# Patient Record
Sex: Male | Born: 1970 | Race: Black or African American | Hispanic: No | Marital: Single | Smoking: Current every day smoker
Health system: Southern US, Community
[De-identification: ages and names within clinical notes are randomized; demographics above are authoritative.]

## PROBLEM LIST (undated history)

## (undated) DIAGNOSIS — K219 Gastro-esophageal reflux disease without esophagitis: Secondary | ICD-10-CM

## (undated) DIAGNOSIS — F32A Depression, unspecified: Secondary | ICD-10-CM

## (undated) DIAGNOSIS — M1990 Unspecified osteoarthritis, unspecified site: Secondary | ICD-10-CM

## (undated) DIAGNOSIS — F419 Anxiety disorder, unspecified: Secondary | ICD-10-CM

## (undated) DIAGNOSIS — M21379 Foot drop, unspecified foot: Secondary | ICD-10-CM

## (undated) HISTORY — PX: HERNIA REPAIR: SHX51

## (undated) HISTORY — PX: CERVICAL LAMINECTOMY: SHX94

## (undated) HISTORY — DX: Anxiety disorder, unspecified: F41.9

## (undated) HISTORY — DX: Unspecified osteoarthritis, unspecified site: M19.90

---

## 2014-07-30 ENCOUNTER — Encounter (HOSPITAL_COMMUNITY): Payer: Self-pay

## 2014-07-30 ENCOUNTER — Emergency Department (HOSPITAL_COMMUNITY)
Admission: EM | Admit: 2014-07-30 | Discharge: 2014-07-30 | Disposition: A | Discharge status: Home / Self Care | Payer: Self-pay | Attending: Emergency Medicine | Admitting: Emergency Medicine

## 2014-07-30 DIAGNOSIS — M79602 Pain in left arm: Secondary | ICD-10-CM | POA: Insufficient documentation

## 2014-07-30 DIAGNOSIS — Z720 Tobacco use: Secondary | ICD-10-CM | POA: Insufficient documentation

## 2014-07-30 MED ORDER — KETOROLAC TROMETHAMINE 60 MG/2ML IM SOLN
60.0000 mg | Freq: Once | INTRAMUSCULAR | Status: AC
  Administered 2014-07-30: 60 mg via INTRAMUSCULAR
  Filled 2014-07-30: qty 2

## 2014-07-30 MED ORDER — NAPROXEN 500 MG PO TABS: 500.0000 mg | ORAL_TABLET | Freq: Two times a day (BID) | ORAL | Status: DC

## 2014-07-30 MED ORDER — DIAZEPAM 5 MG PO TABS
5.0000 mg | ORAL_TABLET | Freq: Once | ORAL | Status: AC
  Administered 2014-07-30: 5 mg via ORAL
  Filled 2014-07-30: qty 1

## 2014-07-30 NOTE — ED Provider Notes (Signed)
CSN: [id number]     Arrival date & time 07/30/14  1112 History   First MD Initiated Contact with Patient 07/30/14 1156     Chief Complaint  Patient presents with  . Neck Pain     (Consider location/radiation/quality/duration/timing/severity/associated sxs/prior Treatment) HPI Thomas Rocha is a 44 y.o. male with a history of cervical laminectomy comes in for evaluation of left neck and arm pain. Patient states he has had "a crick in my neck for the past week". He reports this morning at approximately 3 AM he woke up with sharp shooting pains down his left arm that radiated into his thumb, index and middle finger. He rates this pain as severe. He is tried Tylenol extra strength without relief. Denies numbness or weakness. No headache, vision changes, difficulties speaking or swallowing, chest pain, short of breath, nausea or vomiting, abdominal pain, dark or bloody stools, dizziness, syncope.  History reviewed. No pertinent past medical history. Past Surgical History  Procedure Laterality Date  . Cervical laminectomy    . Hernia repair     History reviewed. No pertinent family history. History  Substance Use Topics  . Smoking status: Current Every Day Smoker -- 1.00 packs/day    Types: Cigarettes  . Smokeless tobacco: Not on file  . Alcohol Use: 3.0 oz/week    5 Cans of beer per week     Comment: 5 beers three timies a week     Review of Systems A 10 point review of systems was completed and was negative except for pertinent positives and negatives as mentioned in the history of present illness     Allergies  Review of patient's allergies indicates no known allergies.  Home Medications   Prior to Admission medications   Medication Sig Start Date End Date Taking? Authorizing Provider  acetaminophen (TYLENOL) 500 MG tablet Take 500 mg by mouth every 6 (six) hours as needed for mild pain.   Yes Historical Provider, MD  naproxen (NAPROSYN) 500 MG tablet Take 1 tablet (500 mg  total) by mouth 2 (two) times daily. 07/30/14   Comer Locket, PA-C   BP 118/78 mmHg  Pulse 59  Temp(Src) 98 F (36.7 C) (Oral)  Resp 16  SpO2 99% Physical Exam  Constitutional: He is oriented to person, place, and time. He appears well-developed and well-nourished.  HENT:  Head: Normocephalic and atraumatic.  Mouth/Throat: Oropharynx is clear and moist.  Eyes: Conjunctivae are normal. Pupils are equal, round, and reactive to light. Right eye exhibits no discharge. Left eye exhibits no discharge. No scleral icterus.  Neck: Normal range of motion. Neck supple.  Old, Linear surgical scar noted over cervical spine. No tenderness to cervical spine, maintains full active range of motion.  Cardiovascular: Normal rate, regular rhythm and normal heart sounds.   Pulmonary/Chest: Effort normal and breath sounds normal. No respiratory distress. He has no wheezes. He has no rales.  Abdominal: Soft. There is no tenderness.  Musculoskeletal: Normal range of motion. He exhibits no edema or tenderness.  Tenderness to palpation over medial epicondyle of left elbow and ulnar nerve.  Neurological: He is alert and oriented to person, place, and time.  Cranial Nerves II-XII grossly intact. Left hand grip strength decreased secondary to discomfort. Completes cardinal hand movements without difficulty.  Skin: Skin is warm and dry. No rash noted.  Psychiatric: He has a normal mood and affect.  Nursing note and vitals reviewed.   ED Course  Procedures (including critical care time) Labs Review Labs Reviewed -  No data to display  Imaging Review No results found.   EKG Interpretation None     Meds given in ED:  Medications  ketorolac (TORADOL) injection 60 mg (60 mg Intramuscular Given 07/30/14 1430)  diazepam (VALIUM) tablet 5 mg (5 mg Oral Given 07/30/14 1429)    Discharge Medication List as of 07/30/2014  2:59 PM    START taking these medications   Details  naproxen (NAPROSYN) 500 MG tablet  Take 1 tablet (500 mg total) by mouth 2 (two) times daily., Starting 07/30/2014, Until Discontinued, Print       Filed Vitals:   07/30/14 1252 07/30/14 1445 07/30/14 1500 07/30/14 1506  BP: 113/64 118/88 118/78 118/78  Pulse: 63 57 60 59  Temp:      TempSrc:      Resp: 16   16  SpO2: 100% 98% 99% 99%    MDM  Vitals stable - WNL -afebrile Pt resting comfortably in ED. states he feels much better after administration of medications in the ED. PE----on reevaluation, patient has full range of motion of left arm and grip strength is intact and equal bilaterally. No neurologic deficits.  DDX--discomfort appears to be musculoskeletal, will DC with anti-inflammatories and instructions for further symptomatic support with stretching, hot cold compresses.  I discussed all relevant lab findings and imaging results with pt and they verbalized understanding. Discussed f/u with PCP within 48 hrs and return precautions, pt very amenable to plan.  Final diagnoses:  Left arm pain        Comer Locket, PA-C 07/30/14 [place], MD 07/31/14 1435

## 2014-07-30 NOTE — ED Notes (Signed)
Pt woke up 2-3 days ago with left neck pain, pt felt like he slept wrong.  Onset this morning pt has shooting pain down left arm and numbness to thumb, index and middle fingers.  No known injuries.

## 2014-07-30 NOTE — Discharge Instructions (Signed)
Please take your medications as directed. You may follow-up with primary care for further evaluation and management of your symptoms. Return to ED for new or worsening symptoms.  Heat Therapy Heat therapy can help make painful, stiff muscles and joints feel better. Do not use heat on new injuries. Wait at least 48 hours after an injury to use heat. Do not use heat when you have aches or pains right after an activity. If you still have pain 3 hours after stopping the activity, then you may use heat. HOME CARE Wet heat pack  Soak a clean towel in warm water. Squeeze out the extra water.  Put the warm, wet towel in a plastic bag.  Place a thin, dry towel between your skin and the bag.  Put the heat pack on the area for 5 minutes, and check your skin. Your skin may be pink, but it should not be red.  Leave the heat pack on the area for 15 to 30 minutes.  Repeat this every 2 to 4 hours while awake. Do not use heat while you are sleeping. Warm water bath  Fill a tub with warm water.  Place the affected body part in the tub.  Soak the area for 20 to 40 minutes.  Repeat as needed. Hot water bottle  Fill the water bottle half full with hot water.  Press out the extra air. Close the cap tightly.  Place a dry towel between your skin and the bottle.  Put the bottle on the area for 5 minutes, and check your skin. Your skin may be pink, but it should not be red.  Leave the bottle on the area for 15 to 30 minutes.  Repeat this every 2 to 4 hours while awake. Electric heating pad  Place a dry towel between your skin and the heating pad.  Set the heating pad on low heat.  Put the heating pad on the area for 10 minutes, and check your skin. Your skin may be pink, but it should not be red.  Leave the heating pad on the area for 20 to 40 minutes.  Repeat this every 2 to 4 hours while awake.  Do not lie on the heating pad.  Do not fall asleep while using the heating pad.  Do not  use the heating pad near water. GET HELP RIGHT AWAY IF:  You get blisters or red skin.  Your skin is puffy (swollen), or you lose feeling (numbness) in the affected area.  You have any new problems.  Your problems are getting worse.  You have any questions or concerns. If you have any problems, stop using heat therapy until you see your doctor. MAKE SURE YOU:  Understand these instructions.  Will watch your condition.  Will get help right away if you are not doing well or get worse. Document Released: 06/03/2011 Document Reviewed: 05/04/2013 [facility] Patient Information 2015 [place]. This information is not intended to replace advice given to you by your health care provider. Make sure you discuss any questions you have with your health care provider.  Musculoskeletal Pain Musculoskeletal pain is muscle and boney aches and pains. These pains can occur in any part of the body. Your caregiver may treat you without knowing the cause of the pain. They may treat you if blood or urine tests, X-rays, and other tests were normal.  CAUSES There is often not a definite cause or reason for these pains. These pains may be caused by a type  of germ (virus). The discomfort may also come from overuse. Overuse includes working out too hard when your body is not fit. Boney aches also come from weather changes. Bone is sensitive to atmospheric pressure changes. HOME CARE INSTRUCTIONS   Ask when your test results will be ready. Make sure you get your test results.  Only take over-the-counter or prescription medicines for pain, discomfort, or fever as directed by your caregiver. If you were given medications for your condition, do not drive, operate machinery or power tools, or sign legal documents for 24 hours. Do not drink alcohol. Do not take sleeping pills or other medications that may interfere with treatment.  Continue all activities unless the activities cause more pain. When the pain  lessens, slowly resume normal activities. Gradually increase the intensity and duration of the activities or exercise.  During periods of severe pain, bed rest may be helpful. Lay or sit in any position that is comfortable.  Putting ice on the injured area.  Put ice in a bag.  Place a towel between your skin and the bag.  Leave the ice on for 15 to 20 minutes, 3 to 4 times a day.  Follow up with your caregiver for continued problems and no reason can be found for the pain. If the pain becomes worse or does not go away, it may be necessary to repeat tests or do additional testing. Your caregiver may need to look further for a possible cause. SEEK IMMEDIATE MEDICAL CARE IF:  You have pain that is getting worse and is not relieved by medications.  You develop chest pain that is associated with shortness or breath, sweating, feeling sick to your stomach (nauseous), or throw up (vomit).  Your pain becomes localized to the abdomen.  You develop any new symptoms that seem different or that concern you. MAKE SURE YOU:   Understand these instructions.  Will watch your condition.  Will get help right away if you are not doing well or get worse. Document Released: 03/11/2005 Document Revised: 06/03/2011 Document Reviewed: 11/13/2012 [facility] Patient Information 2015 [place], [place]. This information is not intended to replace advice given to you by your health care provider. Make sure you discuss any questions you have with your health care provider.

## 2017-01-27 ENCOUNTER — Other Ambulatory Visit: Payer: Self-pay | Admitting: Family Medicine

## 2017-01-27 DIAGNOSIS — M545 Low back pain, unspecified: Secondary | ICD-10-CM

## 2017-02-06 ENCOUNTER — Inpatient Hospital Stay
Admission: RE | Admit: 2017-02-06 | Discharge: 2017-02-06 | Disposition: A | Discharge status: Home / Self Care | Payer: Self-pay | Source: Ambulatory Visit | Attending: Family Medicine | Admitting: Family Medicine

## 2019-04-07 ENCOUNTER — Other Ambulatory Visit: Payer: Self-pay

## 2019-04-08 ENCOUNTER — Ambulatory Visit (INDEPENDENT_AMBULATORY_CARE_PROVIDER_SITE_OTHER): Payer: 59 | Admitting: Family Medicine

## 2019-04-08 ENCOUNTER — Encounter: Payer: Self-pay | Admitting: Family Medicine

## 2019-04-08 VITALS — BP 118/78 | HR 68 | Temp 97.6°F | Ht 72.0 in | Wt 205.0 lb

## 2019-04-08 DIAGNOSIS — Z23 Encounter for immunization: Secondary | ICD-10-CM | POA: Diagnosis not present

## 2019-04-08 DIAGNOSIS — M5416 Radiculopathy, lumbar region: Secondary | ICD-10-CM | POA: Diagnosis not present

## 2019-04-08 DIAGNOSIS — R2681 Unsteadiness on feet: Secondary | ICD-10-CM | POA: Diagnosis not present

## 2019-04-08 NOTE — Patient Instructions (Signed)
Preventing Disease Through Immunization Immunization means developing a lower risk of getting a disease due to improvements in the body's disease-fighting system (immune system). Immunization can happen through:  Natural exposure to a disease.  Getting shots (vaccination). Vaccination involves putting a small amount of germs (vaccines) into the body. This may be done through one or more shots. Some vaccines can be given by mouth or as a nasal spray, instead of a shot. Vaccination helps to prevent:  Serious diseases such as polio, measles, and whooping cough.  Common infections, such as the flu. Vaccination starts at birth. Teens and adults also need vaccines regularly. Talk with your health care provider about the immunization schedule that is best for you. Some vaccines need to be repeated when you are older. How does immunization prevent disease? Immunization occurs when the body is exposed to germs that cause a certain disease. The body responds to this exposure by forming proteins (antibodies) to fight those germs. Germs in vaccines are dead or very weak, so they will not make you sick. However, the antibodies that your body makes will stay in your body for a long time. This improves the ability of your immune system to fight the germs in the future. If you get exposed to the germs again, you may be able to resist them (develop immunity against them). This is because your antibodies may be able to destroy the germs before you get sick. Why should I prevent diseases through immunization? Vaccines can protect you from getting diseases that can cause harmful complications and even death. Getting vaccinated also helps to keep other people healthy. If you are vaccinated, you cannot spread disease to others, and that can make the disease become less common. If people keep getting vaccinated, certain diseases may become rare or go away. If people stop getting vaccinated, certain diseases could become  more common. Not everyone can get a vaccine. Very young babies, people who are very sick, or older people may not be able to get vaccines. By getting immunized, you help to protect people who are not able to be vaccinated. Where to find more information To learn more about immunization, visit:  World Health Organization: www.who.int/topics/immunization/en  Centers for Disease Control and Prevention: www.cdc.gov/vaccines/index.html Summary  Immunization occurs when the body is exposed to germs that cause a certain disease and responds by forming proteins (antibodies) to fight those germs.  Getting vaccines is a safe and effective way to develop immunity against specific germs and the diseases that they cause.  Talk with your health care provider about your immunization schedule, and stay up to date with all of your shots. This information is not intended to replace advice given to you by your health care provider. Make sure you discuss any questions you have with your health care provider. Document Revised: 07/03/2018 Document Reviewed: 11/18/2015 Elsevier Patient Education  2020 [employer]  

## 2019-04-08 NOTE — Addendum Note (Signed)
Addended by: Lynda Rainwater on: 04/08/2019 10:49 AM   Modules accepted: Orders

## 2019-04-08 NOTE — Progress Notes (Signed)
New Patient Office Visit  Subjective:  Patient ID: Thomas Rocha, male    DOB: [birth year]  Age: 49 y.o. MRN: [id number]  CC:  Chief Complaint  Patient presents with  . Establish Care    new pt c/o back pains x few years becoming worse pt would like referral for MRI    HPI Thomas Rocha presents for establishment of medical care with multiple issues.  Primarily he complains of lower back pain with movement of the pain down into his right lower extremity tingling in his toes.  Denies saddle paresthesias or bowel or bladder incontinence.  He had been scheduled to have an MRI of his lower back in the past but lost his insurance.  He also reports tingling in both of his hands and his fingertips.  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  History of lumbar laminectomy.  Neck is doing okay at this point.  He also feels unsteady on his feet and with his gait.  He has been using a cane has to use a shopping cart.  Patient works in apartment maintenance.  He lives with his significant other and her brother.  Consumes alcohol cocaine and marijuana.  No past medical history on file.  Past Surgical History:  Procedure Laterality Date  . CERVICAL LAMINECTOMY    . HERNIA REPAIR      Family History  Problem Relation Age of Onset  . Cancer Mother   . Diabetes Maternal Grandmother   . Hypertension Maternal Grandmother     Social History   Socioeconomic History  . Marital status: Single    Spouse name: Not on file  . Number of children: Not on file  . Years of education: Not on file  . Highest education level: Not on file  Occupational History  . Not on file  Tobacco Use  . Smoking status: Current Every Day Smoker    Packs/day: 1.00    Types: Cigarettes  Substance and Sexual Activity  . Alcohol use: Yes    Alcohol/week: 5.0 standard drinks    Types: 5 Cans of beer per week    Comment: 5 beers three timies a week   . Drug use: Yes    Types: Cocaine, Marijuana    Comment: last used  cocaine 5 days ago   . Sexual activity: Yes  Other Topics Concern  . Not on file  Social History Narrative  . Not on file   Social Determinants of Health   Financial Resource Strain:   . Difficulty of Paying Living Expenses: Not on file  Food Insecurity:   . Worried About Charity fundraiser in the Last Year: Not on file  . Ran Out of Food in the Last Year: Not on file  Transportation Needs:   . Lack of Transportation (Medical): Not on file  . Lack of Transportation (Non-Medical): Not on file  Physical Activity:   . Days of Exercise per Week: Not on file  . Minutes of Exercise per Session: Not on file  Stress:   . Feeling of Stress : Not on file  Social Connections:   . Frequency of Communication with Friends and Family: Not on file  . Frequency of Social Gatherings with Friends and Family: Not on file  . Attends Religious Services: Not on file  . Active Member of Clubs or Organizations: Not on file  . Attends Archivist Meetings: Not on file  . Marital Status: Not on file  Intimate Partner  Violence:   . Fear of Current or Ex-Partner: Not on file  . Emotionally Abused: Not on file  . Physically Abused: Not on file  . Sexually Abused: Not on file    ROS Review of Systems  Constitutional: Negative.   Respiratory: Negative.   Cardiovascular: Negative.   Gastrointestinal: Negative.   Genitourinary: Positive for urgency. Negative for difficulty urinating and frequency.  Musculoskeletal: Positive for back pain, gait problem and myalgias. Negative for joint swelling, neck pain and neck stiffness.  Neurological: Positive for numbness. Negative for weakness and headaches.  Hematological: Does not bruise/bleed easily.  Psychiatric/Behavioral: Negative.     Objective:   Today's Vitals: BP 118/78   Pulse 68   Temp 97.6 F (36.4 C) (Tympanic)   Ht 6' (1.829 m)   Wt 205 lb (93 kg)   SpO2 99%   BMI 27.80 kg/m   Physical Exam Constitutional:      Appearance:  Normal appearance. He is obese.  HENT:     Head: Normocephalic and atraumatic.     Right Ear: External ear normal.     Left Ear: External ear normal.  Eyes:     General: No scleral icterus.       Right eye: No discharge.        Left eye: No discharge.     Extraocular Movements: Extraocular movements intact.     Conjunctiva/sclera: Conjunctivae normal.     Pupils: Pupils are equal, round, and reactive to light.  Pulmonary:     Effort: Pulmonary effort is normal.  Musculoskeletal:     Right shoulder: Normal.     Left shoulder: Normal.     Cervical back: Normal range of motion and neck supple. No rigidity or tenderness. Normal range of motion.     Thoracic back: Normal.     Lumbar back: Normal. No tenderness or bony tenderness. Normal range of motion. Negative right straight leg raise test and negative left straight leg raise test.       Back:  Neurological:     General: No focal deficit present.     Mental Status: He is alert and oriented to person, place, and time.     Cranial Nerves: No cranial nerve deficit.     Coordination: Romberg sign negative.     Gait: Gait normal.     Deep Tendon Reflexes: Reflexes normal.     Reflex Scores:      Tricep reflexes are 1+ on the right side and 1+ on the left side.      Bicep reflexes are 1+ on the right side and 1+ on the left side.      Brachioradialis reflexes are 1+ on the right side and 1+ on the left side.      Patellar reflexes are 1+ on the right side and 1+ on the left side.      Achilles reflexes are 1+ on the right side and 1+ on the left side.    Comments: Had difficulty walking on heels.   Psychiatric:        Mood and Affect: Mood normal.        Behavior: Behavior normal.     Assessment & Plan:   Problem List Items Addressed This Visit      Nervous and Auditory   Lumbar radiculopathy - Primary   Relevant Orders   Ambulatory referral to Sports Medicine     Other   Gait instability   Relevant Orders   Ambulatory  referral  to Neurology      Outpatient Encounter Medications as of 04/08/2019  Medication Sig  . acetaminophen (TYLENOL) 500 MG tablet Take 500 mg by mouth every 6 (six) hours as needed for mild pain.  . naproxen (NAPROSYN) 500 MG tablet Take 1 tablet (500 mg total) by mouth 2 (two) times daily. (Patient not taking: Reported on 04/08/2019)   No facility-administered encounter medications on file as of 04/08/2019.    Follow-up: Return Return fasting for physical exam..   Libby Maw, MD

## 2019-04-09 ENCOUNTER — Encounter: Payer: Self-pay | Admitting: Neurology

## 2019-04-15 ENCOUNTER — Other Ambulatory Visit: Payer: Self-pay

## 2019-04-16 ENCOUNTER — Encounter: Payer: Self-pay | Admitting: Family Medicine

## 2019-04-16 ENCOUNTER — Other Ambulatory Visit: Payer: Self-pay | Admitting: Family Medicine

## 2019-04-16 ENCOUNTER — Ambulatory Visit (INDEPENDENT_AMBULATORY_CARE_PROVIDER_SITE_OTHER): Payer: 59 | Admitting: Family Medicine

## 2019-04-16 VITALS — BP 122/86 | HR 70 | Temp 98.1°F | Ht 73.0 in | Wt 203.2 lb

## 2019-04-16 DIAGNOSIS — Z1211 Encounter for screening for malignant neoplasm of colon: Secondary | ICD-10-CM | POA: Insufficient documentation

## 2019-04-16 DIAGNOSIS — N5201 Erectile dysfunction due to arterial insufficiency: Secondary | ICD-10-CM

## 2019-04-16 DIAGNOSIS — Z0000 Encounter for general adult medical examination without abnormal findings: Secondary | ICD-10-CM

## 2019-04-16 LAB — URINALYSIS, ROUTINE W REFLEX MICROSCOPIC
Bilirubin Urine: NEGATIVE
Ketones, ur: NEGATIVE
Leukocytes,Ua: NEGATIVE
Nitrite: NEGATIVE
Specific Gravity, Urine: 1.025 (ref 1.000–1.030)
Total Protein, Urine: NEGATIVE
Urine Glucose: NEGATIVE
Urobilinogen, UA: 0.2 (ref 0.0–1.0)
pH: 6 (ref 5.0–8.0)

## 2019-04-16 LAB — CBC
HCT: 41.8 % (ref 39.0–52.0)
Hemoglobin: 13.9 g/dL (ref 13.0–17.0)
MCHC: 33.2 g/dL (ref 30.0–36.0)
MCV: 93.3 fl (ref 78.0–100.0)
Platelets: 234 10*3/uL (ref 150.0–400.0)
RBC: 4.48 Mil/uL (ref 4.22–5.81)
RDW: 13.8 % (ref 11.5–15.5)
WBC: 3.2 10*3/uL — ABNORMAL LOW (ref 4.0–10.5)

## 2019-04-16 LAB — COMPREHENSIVE METABOLIC PANEL
ALT: 18 U/L (ref 0–53)
AST: 28 U/L (ref 0–37)
Albumin: 4.4 g/dL (ref 3.5–5.2)
Alkaline Phosphatase: 42 U/L (ref 39–117)
BUN: 15 mg/dL (ref 6–23)
CO2: 23 mEq/L (ref 19–32)
Calcium: 9.2 mg/dL (ref 8.4–10.5)
Chloride: 108 mEq/L (ref 96–112)
Creatinine, Ser: 0.82 mg/dL (ref 0.40–1.50)
GFR: 121.29 mL/min (ref >60.00)
Glucose, Bld: 86 mg/dL (ref 70–99)
Potassium: 4.3 mEq/L (ref 3.5–5.1)
Sodium: 139 mEq/L (ref 135–145)
Total Bilirubin: 0.4 mg/dL (ref 0.2–1.2)
Total Protein: 6.8 g/dL (ref 6.0–8.3)

## 2019-04-16 LAB — LIPID PANEL
Cholesterol: 214 mg/dL — ABNORMAL HIGH (ref 0–200)
HDL: 42.9 mg/dL (ref >39.00)
LDL Cholesterol: 157 mg/dL — ABNORMAL HIGH (ref 0–99)
NonHDL: 170.72
Total CHOL/HDL Ratio: 5
Triglycerides: 68 mg/dL (ref 0.0–149.0)
VLDL: 13.6 mg/dL (ref 0.0–40.0)

## 2019-04-16 LAB — TSH: TSH: 1.01 u[IU]/mL (ref 0.35–4.50)

## 2019-04-16 LAB — PSA: PSA: 0.22 ng/mL (ref 0.10–4.00)

## 2019-04-16 MED ORDER — SILDENAFIL CITRATE 20 MG PO TABS: ORAL_TABLET | ORAL | 1 refills | Status: DC

## 2019-04-16 NOTE — Progress Notes (Signed)
Established Patient Office Visit  Subjective:  Patient ID: Thomas Rocha, male    DOB: Aug 13, 1970  Age: 49 y.o. MRN: [id number]  CC:  Chief Complaint  Patient presents with  . Annual Exam    pt here for annual, no concerns.     HPI Thomas Rocha presents for a physical exam.  He is fasting this morning.  Follow-up with sports medicine for his back as scheduled next week.  He was seen neurology in 2 or 3 weeks.  He was able to have dental care this week and more care is planned.  Has not seen the eye doctor.  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  He does smoke a pack of cigarettes daily.  He drinks up to 1 or 2 beers a day.  He uses cocaine and marijuana sporadically.  He admits to urinary frequency and urgency.  Occasionally has problems with ED.  He drinks 2 cups of coffee in the morning and cream to the throughout the day.  His father died from cirrhosis and he had been a heavy drinker.  His mom passed from pancreatic cancer.  History reviewed. No pertinent past medical history.  Past Surgical History:  Procedure Laterality Date  . CERVICAL LAMINECTOMY    . HERNIA REPAIR      Family History  Problem Relation Age of Onset  . Cancer Mother   . Diabetes Maternal Grandmother   . Hypertension Maternal Grandmother     Social History   Socioeconomic History  . Marital status: Single    Spouse name: Not on file  . Number of children: Not on file  . Years of education: Not on file  . Highest education level: Not on file  Occupational History  . Not on file  Tobacco Use  . Smoking status: Current Every Day Smoker    Packs/day: 1.00    Types: Cigarettes  Substance and Sexual Activity  . Alcohol use: Yes    Alcohol/week: 5.0 standard drinks    Types: 5 Cans of beer per week    Comment: 5 beers three timies a week   . Drug use: Yes    Types: Cocaine, Marijuana    Comment: last used cocaine 5 days ago   . Sexual activity: Yes  Other Topics Concern  . Not on  file  Social History Narrative  . Not on file   Social Determinants of Health   Financial Resource Strain:   . Difficulty of Paying Living Expenses: Not on file  Food Insecurity:   . Worried About Charity fundraiser in the Last Year: Not on file  . Ran Out of Food in the Last Year: Not on file  Transportation Needs:   . Lack of Transportation (Medical): Not on file  . Lack of Transportation (Non-Medical): Not on file  Physical Activity:   . Days of Exercise per Week: Not on file  . Minutes of Exercise per Session: Not on file  Stress:   . Feeling of Stress : Not on file  Social Connections:   . Frequency of Communication with Friends and Family: Not on file  . Frequency of Social Gatherings with Friends and Family: Not on file  . Attends Religious Services: Not on file  . Active Member of Clubs or Organizations: Not on file  . Attends Archivist Meetings: Not on file  . Marital Status: Not on file  Intimate Partner Violence:   . Fear of Current or Ex-Partner:  Not on file  . Emotionally Abused: Not on file  . Physically Abused: Not on file  . Sexually Abused: Not on file    Outpatient Medications Prior to Visit  Medication Sig Dispense Refill  . acetaminophen (TYLENOL) 500 MG tablet Take 500 mg by mouth every 6 (six) hours as needed for mild pain.    . naproxen (NAPROSYN) 500 MG tablet Take 1 tablet (500 mg total) by mouth 2 (two) times daily. (Patient not taking: Reported on 04/08/2019) 30 tablet 0   No facility-administered medications prior to visit.    No Known Allergies  ROS Review of Systems  Constitutional: Negative for diaphoresis, fatigue, fever and unexpected weight change.  HENT: Negative.   Eyes: Negative for photophobia and visual disturbance.  Respiratory: Negative.   Cardiovascular: Negative.   Gastrointestinal: Negative.   Endocrine: Negative for polyphagia.  Genitourinary: Positive for frequency. Negative for difficulty urinating.   Musculoskeletal: Positive for back pain and gait problem.  Allergic/Immunologic: Negative for immunocompromised state.  Hematological: Does not bruise/bleed easily.  Psychiatric/Behavioral: Negative.       Objective:    Physical Exam  Constitutional: He is oriented to person, place, and time. He appears well-developed and well-nourished. No distress.  HENT:  Head: Normocephalic and atraumatic.  Right Ear: External ear normal.  Left Ear: External ear normal.  Mouth/Throat: Oropharynx is clear and moist. No oropharyngeal exudate.  Eyes: Pupils are equal, round, and reactive to light. Conjunctivae are normal. Right eye exhibits no discharge. Left eye exhibits no discharge. No scleral icterus.  Neck: No JVD present. No tracheal deviation present. No thyromegaly present.  Cardiovascular: Normal rate, regular rhythm and normal heart sounds.  Pulmonary/Chest: Effort normal and breath sounds normal. No stridor. No respiratory distress. He has no wheezes. He has no rales.  Abdominal: Soft. Bowel sounds are normal. He exhibits no distension. There is no abdominal tenderness. There is no rebound and no guarding. Hernia confirmed negative in the right inguinal area and confirmed negative in the left inguinal area.  Genitourinary: Rectum:     Guaiac result negative.     No rectal mass, anal fissure, tenderness, external hemorrhoid, internal hemorrhoid or abnormal anal tone.  Prostate is enlarged. Prostate is not tender. Right testis shows no mass, no swelling and no tenderness. Right testis is descended. Left testis shows no mass, no swelling and no tenderness. Left testis is descended. Circumcised. No hypospadias, penile erythema or penile tenderness. No discharge found.  Musculoskeletal:        General: No edema.     Cervical back: Normal range of motion and neck supple.  Lymphadenopathy:    He has no cervical adenopathy.       Right: No inguinal adenopathy present.       Left: No inguinal  adenopathy present.  Neurological: He is oriented to person, place, and time.  Skin: Skin is warm and dry. He is not diaphoretic.  Psychiatric: He has a normal mood and affect. His behavior is normal.    BP 122/86   Pulse 70   Temp 98.1 F (36.7 C) (Tympanic)   Ht 6' 1"  (1.854 m)   Wt 203 lb 3.2 oz (92.2 kg)   SpO2 99%   BMI 26.81 kg/m  Wt Readings from Last 3 Encounters:  04/16/19 203 lb 3.2 oz (92.2 kg)  04/08/19 205 lb (93 kg)     Health Maintenance Due  Topic Date Due  . HIV Screening  03/10/1986  . INFLUENZA VACCINE  10/24/2018  There are no preventive care reminders to display for this patient.  No results found for: TSH No results found for: WBC, HGB, HCT, MCV, PLT No results found for: NA, K, CHLORIDE, CO2, GLUCOSE, BUN, CREATININE, BILITOT, ALKPHOS, AST, ALT, PROT, ALBUMIN, CALCIUM, ANIONGAP, EGFR, GFR No results found for: CHOL No results found for: HDL No results found for: LDLCALC No results found for: TRIG No results found for: CHOLHDL No results found for: HGBA1C    Assessment & Plan:   Problem List Items Addressed This Visit    None    Visit Diagnoses    Healthcare maintenance    -  Primary   Relevant Orders   CBC   Comprehensive metabolic panel   HIV Antibody (routine testing w rflx)   Lipid panel   PSA   TSH   Urinalysis, Routine w reflex microscopic      No orders of the defined types were placed in this encounter.   Follow-up: Return in about 1 year (around 04/15/2020), or if symptoms worsen or fail to improve.  Patient was given information on health maintenance and disease prevention.  Also he was given information on the harmful effects of smoking as well as information on cocaine and marijuana.  Libby Maw, MD

## 2019-04-16 NOTE — Patient Instructions (Addendum)
Health Maintenance, Male Adopting a healthy lifestyle and getting preventive care are important in promoting health and wellness. Ask your health care provider about:  The right schedule for you to have regular tests and exams.  Things you can do on your own to prevent diseases and keep yourself healthy. What should I know about diet, weight, and exercise? Eat a healthy diet   Eat a diet that includes plenty of vegetables, fruits, low-fat dairy products, and lean protein.  Do not eat a lot of foods that are high in solid fats, added sugars, or sodium. Maintain a healthy weight Body mass index (BMI) is a measurement that can be used to identify possible weight problems. It estimates body fat based on height and weight. Your health care provider can help determine your BMI and help you achieve or maintain a healthy weight. Get regular exercise Get regular exercise. This is one of the most important things you can do for your health. Most adults should:  Exercise for at least 150 minutes each week. The exercise should increase your heart rate and make you sweat (moderate-intensity exercise).  Do strengthening exercises at least twice a week. This is in addition to the moderate-intensity exercise.  Spend less time sitting. Even light physical activity can be beneficial. Watch cholesterol and blood lipids Have your blood tested for lipids and cholesterol at 49 years of age, then have this test every 5 years. You may need to have your cholesterol levels checked more often if:  Your lipid or cholesterol levels are high.  You are older than 49 years of age.  You are at high risk for heart disease. What should I know about cancer screening? Many types of cancers can be detected early and may often be prevented. Depending on your health history and family history, you may need to have cancer screening at various ages. This may include screening for:  Colorectal cancer.  Prostate cancer.   Skin cancer.  Lung cancer. What should I know about heart disease, diabetes, and high blood pressure? Blood pressure and heart disease  High blood pressure causes heart disease and increases the risk of stroke. This is more likely to develop in people who have high blood pressure readings, are of African descent, or are overweight.  Talk with your health care provider about your target blood pressure readings.  Have your blood pressure checked: ? Every 3-5 years if you are 57-13 years of age. ? Every year if you are 84 years old or older.  If you are between the ages of 35 and 37 and are a current or former smoker, ask your health care provider if you should have a one-time screening for abdominal aortic aneurysm (AAA). Diabetes Have regular diabetes screenings. This checks your fasting blood sugar level. Have the screening done:  Once every three years after age 28 if you are at a normal weight and have a low risk for diabetes.  More often and at a younger age if you are overweight or have a high risk for diabetes. What should I know about preventing infection? Hepatitis B If you have a higher risk for hepatitis B, you should be screened for this virus. Talk with your health care provider to find out if you are at risk for hepatitis B infection. Hepatitis C Blood testing is recommended for:  Everyone born from [birth year] through 1965.  Anyone with known risk factors for hepatitis C. Sexually transmitted infections (STIs)  You should be screened each year  for STIs, including gonorrhea and chlamydia, if: ? You are sexually active and are younger than 49 years of age. ? You are older than 49 years of age and your health care provider tells you that you are at risk for this type of infection. ? Your sexual activity has changed since you were last screened, and you are at increased risk for chlamydia or gonorrhea. Ask your health care provider if you are at risk.  Ask your health care  provider about whether you are at high risk for HIV. Your health care provider may recommend a prescription medicine to help prevent HIV infection. If you choose to take medicine to prevent HIV, you should first get tested for HIV. You should then be tested every 3 months for as long as you are taking the medicine. Follow these instructions at home: Lifestyle  Do not use any products that contain nicotine or tobacco, such as cigarettes, e-cigarettes, and chewing tobacco. If you need help quitting, ask your health care provider.  Do not use street drugs.  Do not share needles.  Ask your health care provider for help if you need support or information about quitting drugs. Alcohol use  Do not drink alcohol if your health care provider tells you not to drink.  If you drink alcohol: ? Limit how much you have to 0-2 drinks a day. ? Be aware of how much alcohol is in your drink. In the [place] one drink equals one 12 oz bottle of beer (355 mL), one 5 oz glass of wine (148 mL), or one 1 oz glass of hard liquor (44 mL). General instructions  Schedule regular health, dental, and eye exams.  Stay current with your vaccines.  Tell your health care provider if: ? You often feel depressed. ? You have ever been abused or do not feel safe at home. Summary  Adopting a healthy lifestyle and getting preventive care are important in promoting health and wellness.  Follow your health care provider's instructions about healthy diet, exercising, and getting tested or screened for diseases.  Follow your health care provider's instructions on monitoring your cholesterol and blood pressure. This information is not intended to replace advice given to you by your health care provider. Make sure you discuss any questions you have with your health care provider. Document Revised: 03/04/2018 Document Reviewed: 03/04/2018 Elsevier Patient Education  2020 Else[employer] Inc.  Preventive Care 61-26 Years Old, Male  Preventive care refers to lifestyle choices and visits with your health care provider that can promote health and wellness. This includes:  A yearly physical exam. This is also called an annual well check.  Regular dental and eye exams.  Immunizations.  Screening for certain conditions.  Healthy lifestyle choices, such as eating a healthy diet, getting regular exercise, not using drugs or products that contain nicotine and tobacco, and limiting alcohol use. What can I expect for my preventive care visit? Physical exam Your health care provider will check:  Height and weight. These may be used to calculate body mass index (BMI), which is a measurement that tells if you are at a healthy weight.  Heart rate and blood pressure.  Your skin for abnormal spots. Counseling Your health care provider may ask you questions about:  Alcohol, tobacco, and drug use.  Emotional well-being.  Home and relationship well-being.  Sexual activity.  Eating habits.  Work and work Statistician. What immunizations do I need?  Influenza (flu) vaccine  This is recommended every year. Tetanus, diphtheria,  and pertussis (Tdap) vaccine  You may need a Td booster every 10 years. Varicella (chickenpox) vaccine  You may need this vaccine if you have not already been vaccinated. Zoster (shingles) vaccine  You may need this after age 6. Measles, mumps, and rubella (MMR) vaccine  You may need at least one dose of MMR if you were born in 1957 or later. You may also need a second dose. Pneumococcal conjugate (PCV13) vaccine  You may need this if you have certain conditions and were not previously vaccinated. Pneumococcal polysaccharide (PPSV23) vaccine  You may need one or two doses if you smoke cigarettes or if you have certain conditions. Meningococcal conjugate (MenACWY) vaccine  You may need this if you have certain conditions. Hepatitis A vaccine  You may need this if you have certain  conditions or if you travel or work in places where you may be exposed to hepatitis A. Hepatitis B vaccine  You may need this if you have certain conditions or if you travel or work in places where you may be exposed to hepatitis B. Haemophilus influenzae type b (Hib) vaccine  You may need this if you have certain risk factors. Human papillomavirus (HPV) vaccine  If recommended by your health care provider, you may need three doses over 6 months. You may receive vaccines as individual doses or as more than one vaccine together in one shot (combination vaccines). Talk with your health care provider about the risks and benefits of combination vaccines. What tests do I need? Blood tests  Lipid and cholesterol levels. These may be checked every 5 years, or more frequently if you are over 69 years old.  Hepatitis C test.  Hepatitis B test. Screening  Lung cancer screening. You may have this screening every year starting at age 35 if you have a 30-pack-year history of smoking and currently smoke or have quit within the past 15 years.  Prostate cancer screening. Recommendations will vary depending on your family history and other risks.  Colorectal cancer screening. All adults should have this screening starting at age 56 and continuing until age 8. Your health care provider may recommend screening at age 73 if you are at increased risk. You will have tests every 1-10 years, depending on your results and the type of screening test.  Diabetes screening. This is done by checking your blood sugar (glucose) after you have not eaten for a while (fasting). You may have this done every 1-3 years.  Sexually transmitted disease (STD) testing. Follow these instructions at home: Eating and drinking  Eat a diet that includes fresh fruits and vegetables, whole grains, lean protein, and low-fat dairy products.  Take vitamin and mineral supplements as recommended by your health care provider.  Do not  drink alcohol if your health care provider tells you not to drink.  If you drink alcohol: ? Limit how much you have to 0-2 drinks a day. ? Be aware of how much alcohol is in your drink. In the [place] one drink equals one 12 oz bottle of beer (355 mL), one 5 oz glass of wine (148 mL), or one 1 oz glass of hard liquor (44 mL). Lifestyle  Take daily care of your teeth and gums.  Stay active. Exercise for at least 30 minutes on 5 or more days each week.  Do not use any products that contain nicotine or tobacco, such as cigarettes, e-cigarettes, and chewing tobacco. If you need help quitting, ask your health care provider.  If  you are sexually active, practice safe sex. Use a condom or other form of protection to prevent STIs (sexually transmitted infections).  Talk with your health care provider about taking a low-dose aspirin every day starting at age 70. What's next?  Go to your health care provider once a year for a well check visit.  Ask your health care provider how often you should have your eyes and teeth checked.  Stay up to date on all vaccines. This information is not intended to replace advice given to you by your health care provider. Make sure you discuss any questions you have with your health care provider. Document Revised: 03/05/2018 Document Reviewed: 03/05/2018 Elsevier Patient Education  [place].  Stimulant Use Disorder-Cocaine Cocaine belongs to a group of powerful drugs known as stimulants. Common street names for cocaine include coke, crack, blow, snow, C, powder, and nose candy. Cocaine has some medical uses, but it is often misused because of the effects that it produces. These effects include:  A feeling of extreme pleasure (euphoria).  Alertness.  A high energy level. Stimulant use disorder is when your stimulant use disrupts your daily life. It may disrupt your relationships and how you do your job. Stimulant use disorder can be dangerous. Cocaine  increases your blood pressure and heart rate. Using it can lead to a heart attack or stroke. Cocaine can also make your heart rate irregular and cause seizures. These problems can lead to death. What are the causes? This condition is caused by misusing cocaine. Many people start using cocaine because it makes them feel good. Over time, they get addicted to it. When they try to stop using it, they feel sick. What increases the risk? This condition is more likely to develop in:  People who misuse other drugs.  People with a family history of misusing drugs. What are the signs or symptoms? Symptoms of this condition include:  Using greater amounts of cocaine than you want to, or using cocaine for longer than you want to.  Trying several times to use less cocaine or to control your cocaine use.  Craving cocaine.  Spending a lot of time getting cocaine, using it, or recovering from its effects.  Having problems at work, at school, at home, or with relationships because of cocaine use.  Giving up or cutting down on important life activities because of cocaine use.  Using cocaine when it is dangerous, such as when driving a car.  Continuing to use cocaine even though it is causing or has led to a physical problem, such as: ? Malnutrition. ? Nosebleeds. ? Chest pain. ? High blood pressure. ? A hole between the part of your nose that separates your nostrils (perforated nasal septum). ? Lung and kidney damage.  Continuing to use cocaine even though it is causing a mental problem, such as: ? Schizophrenia-like symptoms. ? Depression. ? Bipolar mood swings. ? Anxiety. ? Sleep problems.  Needing more and more cocaine to get the same effect that you want (building up a tolerance).  Having symptoms of withdrawal when you stop using cocaine. Symptoms of withdrawal include: ? Depression. ? Irritability. ? Low energy. ? Restlessness. ? Bad dreams. ? Too little or too much sleep. ?  Increased appetite. How is this diagnosed? This condition is diagnosed with an assessment. During the assessment, your health care provider will ask about your cocaine use and about how it affects your life. Your health care provider may also:  Perform a physical exam or  do lab tests to see if you have physical problems resulting from cocaine use.  Screen for drug use.  Refer you to a mental health professional for evaluation. How is this treated? Treatment for this condition is usually provided by mental health professionals with training in substance use disorders. Treatment may involve:  Counseling. This treatment is also called talk therapy. It is provided by substance use treatment counselors. A counselor can address the reasons you use cocaine and suggest ways to keep you from using it again. The goals of talk therapy are to: ? Find healthy activities to replace using cocaine. ? Identify and avoid what triggers your cocaine use. ? Help you learn how to handle cravings.  Support groups. Support groups are run by people who have quit using stimulants. They provide emotional support, advice, and guidance.  Medicines. Follow these instructions at home:  Take over-the-counter and prescription medicines only as told by your health care provider.  Check with your health care provider before starting any new medicines.  Do not use any products that contain nicotine or tobacco, such as cigarettes and e-cigarettes. If you need help quitting, ask your health care provider.  Keep all follow-up visits as told by your health care provider. This is important. Where to find more information  [employer] on Drug Abuse: motorcyclefax.com  Substance Abuse and Mental Health Services Administration: ktimeonline.com Contact a health care provider if:  You are not able to take your medicines as told.  You use cocaine again.  Your symptoms get worse. Get help right away if:  You have  serious thoughts about hurting yourself or others.  You have a seizure.  You have chest pain.  You have sudden weakness.  You lose some of your vision.  You lose some of your speech. If you ever feel like you may hurt yourself or others, or have thoughts about taking your own life, get help right away. You can go to your nearest emergency department or call:  Your local emergency services (911 in the [place]  A suicide crisis helpline, such as the [facility] at (210) 649-9983. This is open 24 hours a day. This information is not intended to replace advice given to you by your health care provider. Make sure you discuss any questions you have with your health care provider. Document Revised: 02/21/2017 Document Reviewed: 12/22/2015 Elsevier Patient Education  2020 [employer].  Smoking Tobacco Information, Adult Smoking tobacco can be harmful to your health. Tobacco contains a poisonous (toxic), colorless chemical called nicotine. Nicotine is addictive. It changes the brain and can make it hard to stop smoking. Tobacco also has other toxic chemicals that can hurt your body and raise your risk of many cancers. How can smoking tobacco affect me? Smoking tobacco puts you at risk for:  Cancer. Smoking is most commonly associated with lung cancer, but can also lead to cancer in other parts of the body.  Chronic obstructive pulmonary disease (COPD). This is a long-term lung condition that makes it hard to breathe. It also gets worse over time.  High blood pressure (hypertension), heart disease, stroke, or heart attack.  Lung infections, such as pneumonia.  Cataracts. This is when the lenses in the eyes become clouded.  Digestive problems. This may include peptic ulcers, heartburn, and gastroesophageal reflux disease (GERD).  Oral health problems, such as gum disease and tooth loss.  Loss of taste and smell. Smoking can affect your appearance by causing:   Wrinkles.  Yellow  or stained teeth, fingers, and fingernails. Smoking tobacco can also affect your social life, because:  It may be challenging to find places to smoke when away from home. Many workplaces, [employer], hotels, and public places are tobacco-free.  Smoking is expensive. This is due to the cost of tobacco and the long-term costs of treating health problems from smoking.  Secondhand smoke may affect those around you. Secondhand smoke can cause lung cancer, breathing problems, and heart disease. Children of smokers have a higher risk for: ? Sudden infant death syndrome (SIDS). ? Ear infections. ? Lung infections. If you currently smoke tobacco, quitting now can help you:  Lead a longer and healthier life.  Look, smell, breathe, and feel better over time.  Save money.  Protect others from the harms of secondhand smoke. What actions can I take to prevent health problems? Quit smoking   Do not start smoking. Quit if you already do.  Make a plan to quit smoking and commit to it. Look for programs to help you and ask your health care provider for recommendations and ideas.  Set a date and write down all the reasons you want to quit.  Let your friends and family know you are quitting so they can help and support you. Consider finding friends who also want to quit. It can be easier to quit with someone else, so that you can support each other.  Talk with your health care provider about using nicotine replacement medicines to help you quit, such as gum, lozenges, patches, sprays, or pills.  Do not replace cigarette smoking with electronic cigarettes, which are commonly called e-cigarettes. The safety of e-cigarettes is not known, and some may contain harmful chemicals.  If you try to quit but return to smoking, stay positive. It is common to slip up when you first quit, so take it one day at a time.  Be prepared for cravings. When you feel the urge to smoke, chew gum or  suck on hard candy. Lifestyle  Stay busy and take care of your body.  Drink enough fluid to keep your urine pale yellow.  Get plenty of exercise and eat a healthy diet. This can help prevent weight gain after quitting.  Monitor your eating habits. Quitting smoking can cause you to have a larger appetite than when you smoke.  Find ways to relax. Go out with friends or family to a movie or a restaurant where people do not smoke.  Ask your health care provider about having regular tests (screenings) to check for cancer. This may include blood tests, imaging tests, and other tests.  Find ways to manage your stress, such as meditation, yoga, or exercise. Where to find support To get support to quit smoking, consider:  Asking your health care provider for more information and resources.  Taking classes to learn more about quitting smoking.  Looking for local organizations that offer resources about quitting smoking.  Joining a support group for people who want to quit smoking in your local community.  Calling the smokefree.gov counselor helpline: 1-800-Quit-Now (272)776-6472) Where to find more information You may find more information about quitting smoking from:  HelpGuide.org: www.helpguide.org  https://hall.com/: smokefree.gov  American Lung Association: www.lung.org Contact a health care provider if you:  Have problems breathing.  Notice that your lips, nose, or fingers turn blue.  Have chest pain.  Are coughing up blood.  Feel faint or you pass out.  Have other health changes that cause you to worry. Summary  Smoking  tobacco can negatively affect your health, the health of those around you, your finances, and your social life.  Do not start smoking. Quit if you already do. If you need help quitting, ask your health care provider.  Think about joining a support group for people who want to quit smoking in your local community. There are many effective programs that  will help you to quit this behavior. This information is not intended to replace advice given to you by your health care provider. Make sure you discuss any questions you have with your health care provider. Document Revised: 12/04/2018 Document Reviewed: 03/26/2016 Elsevier Patient Education  2020 [employer].

## 2019-04-17 LAB — HIV ANTIBODY (ROUTINE TESTING W REFLEX): HIV 1&2 Ab, 4th Generation: NONREACTIVE

## 2019-04-19 ENCOUNTER — Ambulatory Visit (INDEPENDENT_AMBULATORY_CARE_PROVIDER_SITE_OTHER): Payer: 59 | Admitting: Family Medicine

## 2019-04-19 ENCOUNTER — Other Ambulatory Visit: Payer: Self-pay

## 2019-04-19 ENCOUNTER — Encounter: Payer: Self-pay | Admitting: Family Medicine

## 2019-04-19 ENCOUNTER — Ambulatory Visit (INDEPENDENT_AMBULATORY_CARE_PROVIDER_SITE_OTHER): Payer: 59

## 2019-04-19 VITALS — BP 124/82 | HR 71 | Ht 73.0 in | Wt 207.0 lb

## 2019-04-19 DIAGNOSIS — M5441 Lumbago with sciatica, right side: Secondary | ICD-10-CM

## 2019-04-19 DIAGNOSIS — G8929 Other chronic pain: Secondary | ICD-10-CM | POA: Diagnosis not present

## 2019-04-19 DIAGNOSIS — M21371 Foot drop, right foot: Secondary | ICD-10-CM | POA: Diagnosis not present

## 2019-04-19 DIAGNOSIS — M5442 Lumbago with sciatica, left side: Secondary | ICD-10-CM

## 2019-04-19 MED ORDER — GABAPENTIN 300 MG PO CAPS: ORAL_CAPSULE | ORAL | 3 refills | Status: DC

## 2019-04-19 NOTE — Patient Instructions (Addendum)
Thank you for coming in today. Plan for xray and then likely followed by Lumbar Spine MRI.  Try gabapentin for nerve pain.  You should hear about lumbar MRI soon.  You should also hear from [place] soon about AFO (ankle foot orthosis) for foot drop.    Radicular Pain Radicular pain is a type of pain that spreads from your back or neck along a spinal nerve. Spinal nerves are nerves that leave the spinal cord and go to the muscles. Radicular pain is sometimes called radiculopathy, radiculitis, or a pinched nerve. When you have this type of pain, you may also have weakness, numbness, or tingling in the area of your body that is supplied by the nerve. The pain may feel sharp and burning. Depending on which spinal nerve is affected, the pain may occur in the:  Neck area (cervical radicular pain). You may also feel pain, numbness, weakness, or tingling in the arms.  Mid-spine area (thoracic radicular pain). You would feel this pain in the back and chest. This type is rare.  Lower back area (lumbar radicular pain). You would feel this pain as low back pain. You may feel pain, numbness, weakness, or tingling in the buttocks or legs. Sciatica is a type of lumbar radicular pain that shoots down the back of the leg. Radicular pain occurs when one of the spinal nerves becomes irritated or squeezed (compressed). It is often caused by something pushing on a spinal nerve, such as one of the bones of the spine (vertebrae) or one of the round cushions between vertebrae (intervertebral disks). This can result from:  An injury.  Wear and tear or aging of a disk.  The growth of a bone spur that pushes on the nerve. Radicular pain often goes away when you follow instructions from your health care provider for relieving pain at home. Follow these instructions at home: Managing pain      If directed, put ice on the affected area: ? Put ice in a plastic bag. ? Place a towel between your skin and  the bag. ? Leave the ice on for 20 minutes, 2-3 times a day.  If directed, apply heat to the affected area as often as told by your health care provider. Use the heat source that your health care provider recommends, such as a moist heat pack or a heating pad. ? Place a towel between your skin and the heat source. ? Leave the heat on for 20-30 minutes. ? Remove the heat if your skin turns bright red. This is especially important if you are unable to feel pain, heat, or cold. You may have a greater risk of getting burned. Activity   Do not sit or rest in bed for long periods of time.  Try to stay as active as possible. Ask your health care provider what type of exercise or activity is best for you.  Avoid activities that make your pain worse, such as bending and lifting.  Do not lift anything that is heavier than 10 lb (4.5 kg), or the limit that you are told, until your health care provider says that it is safe.  Practice using proper technique when lifting items. Proper lifting technique involves bending your knees and rising up.  Do strength and range-of-motion exercises only as told by your health care provider or physical therapist. General instructions  Take over-the-counter and prescription medicines only as told by your health care provider.  Pay attention to any changes in your symptoms.  Keep all follow-up visits as told by your health care provider. This is important. ? Your health care provider may send you to a physical therapist to help with this pain. Contact a health care provider if:  Your pain and other symptoms get worse.  Your pain medicine is not helping.  Your pain has not improved after a few weeks of home care.  You have a fever. Get help right away if:  You have severe pain, weakness, or numbness.  You have difficulty with bladder or bowel control. Summary  Radicular pain is a type of pain that spreads from your back or neck along a spinal  nerve.  When you have radicular pain, you may also have weakness, numbness, or tingling in the area of your body that is supplied by the nerve.  The pain may feel sharp or burning.  Radicular pain may be treated with ice, heat, medicines, or physical therapy. This information is not intended to replace advice given to you by your health care provider. Make sure you discuss any questions you have with your health care provider. Document Revised: 09/23/2017 Document Reviewed: 09/23/2017 Elsevier Patient Education  [place].

## 2019-04-19 NOTE — Progress Notes (Signed)
Subjective:    I'm seeing this patient as a consultation for:  Dr. Ethelene Hal. Note will be routed back to referring provider/PCP.  CC: Low back pain and R LE pain w/ tingling in toes  I, Molly Weber, LAT, ATC, am serving as scribe for Dr. Lynne Leader.  HPI: Pt is a 49 y/o male presenting w/ c/o chronic low back pain x 1.5 years and R LE pain w/ numbness in his R toes.  He has a prior hx of a cervical laminectomy.  He states that he felt like he was having a stroke about 1.5 years ago because he was having R-sided symptoms and ever since then he noticed R foot drop.  He saw his PCP on 04/08/19 and was referred to Sports Medicine.  He was scheduled for an MRI of his L-spine previously but wasn't able to get it due to losing his insurance.    He states that he trips a lot due to his R LE weakness and R foot drop.  He reports increased urinary urgency as well.  He also reports weakness in his B shoulders and feels like he is losing muscle mass in his shoulders.  Was seen at [place]    Past medical history, Surgical history, Family history, Social history, Allergies, and medications have been entered into the medical record, reviewed.   Review of Systems: No new headache, visual changes, nausea, vomiting, diarrhea, constipation, dizziness, abdominal pain, skin rash, fevers, chills, night sweats, weight loss, swollen lymph nodes, body aches, joint swelling, muscle aches, chest pain, shortness of breath, mood changes, visual or auditory hallucinations.   Objective:    Vitals:   04/19/19 1547  BP: 124/82  Pulse: 71  SpO2: 98%   General: Well Developed, well nourished, and in no acute distress.  Neuro/Psych: Alert and oriented x3, extra-ocular muscles intact, able to move all 4 extremities, sensation grossly intact. Skin: Warm and dry, no rashes noted.  Respiratory: Not using accessory muscles, speaking in full sentences, trachea midline.  Cardiovascular: Pulses  palpable, no extremity edema. Abdomen: Does not appear distended. MSK:  L-spine: Nontender spinal midline. Tender palpation lumbar paraspinal musculature. Normal lumbar motion. Lower extremity strength intact with the exception of foot dorsiflexion. Right foot dorsiflexion 3/5.  Left 4/5.   Gait significant for dropfoot and slapping of the floor right side. Sensation intact bilateral lower extremities.   Lab and Radiology Results   DG Lumbar Spine Complete  Result Date: 04/20/2019 CLINICAL DATA:  Low back pain. EXAM: LUMBAR SPINE - COMPLETE 4+ VIEW COMPARISON:  January 14, 2017 FINDINGS: There is no evidence of lumbar spine fracture. Approximately 7 mm anterolisthesis of the L4 on L5 vertebral body is seen. This is present on the prior exam. Mild to moderate severity intervertebral disc space narrowing is seen at the level of L4-L5. IMPRESSION: 1. Stable, approximately 7 mm anterolisthesis of L4 on L5 vertebral body. 2. Mild to moderate severity degenerative changes at the level of L4-L5. Electronically Signed   By: Virgina Norfolk M.D.   On: 04/20/2019 00:15  I, Lynne Leader, personally (independently) visualized and performed the interpretation of the images attached in this note.    Impression and Recommendations:    Assessment and Plan: 49 y.o. male with low back pain with pain radiating bilateral lower extremities with bilateral weakness to foot dorsiflexion right worse than left.  Patient will benefit from significant work-up.  Proceed with x-ray as above likely followed by lumbar MRI rapidly.  Additionally will arrange for an ankle-foot orthosis on the right side. Recheck following MRI. Additionally patient has a appointment scheduled with neurology on February 19.  This will also be helpful as he likely will benefit from nerve conduction study and possibly central nervous system imaging to evaluate for MS.     Orders Placed This Encounter  Procedures  . DG Lumbar Spine  Complete    Standing Status:   Future    Standing Expiration Date:   06/16/2020    Order Specific Question:   Reason for Exam (SYMPTOM  OR DIAGNOSIS REQUIRED)    Answer:   eval chronic low back pain and BL lumbar rad. Right foot drop    Order Specific Question:   Preferred imaging location?    Answer:   [facility]    Order Specific Question:   Radiology Contrast Protocol - do NOT remove file path    Answer:   [email or web address]   Meds ordered this encounter  Medications  . gabapentin (NEURONTIN) 300 MG capsule    Sig: One tab PO qHS for a week, then BID for a week, then TID. May double weekly to a max of 3,600mg /day    Dispense:  180 capsule    Refill:  3    Discussed warning signs or symptoms. Please see discharge instructions. Patient expresses understanding.   The above documentation has been reviewed and is accurate and complete Lynne Leader

## 2019-04-20 DIAGNOSIS — M21371 Foot drop, right foot: Secondary | ICD-10-CM | POA: Insufficient documentation

## 2019-04-20 MED ORDER — AMBULATORY NON FORMULARY MEDICATION: 0 refills | Status: AC

## 2019-04-20 NOTE — Addendum Note (Signed)
Addended by: Gregor Hams on: 04/20/2019 08:10 AM   Modules accepted: Orders

## 2019-04-20 NOTE — Progress Notes (Signed)
X-ray L-spine shows the vertebrae are able to shift forward at one point in your low back that can cause his nerves to be pinched.  MRI will show this very well.  This could explain your symptoms in your lower leg.  I have ordered an MRI at [facility] as that location has less of a weight than the one in [place].

## 2019-04-21 ENCOUNTER — Telehealth: Payer: Self-pay | Admitting: Family Medicine

## 2019-04-21 NOTE — Telephone Encounter (Signed)
Patient is calling and wanted to speak to someone regarding medication. CB is [phone]

## 2019-05-02 ENCOUNTER — Other Ambulatory Visit: Payer: Self-pay

## 2019-05-02 ENCOUNTER — Ambulatory Visit (INDEPENDENT_AMBULATORY_CARE_PROVIDER_SITE_OTHER): Payer: 59

## 2019-05-02 DIAGNOSIS — M5441 Lumbago with sciatica, right side: Secondary | ICD-10-CM

## 2019-05-02 DIAGNOSIS — M5442 Lumbago with sciatica, left side: Secondary | ICD-10-CM

## 2019-05-02 DIAGNOSIS — G8929 Other chronic pain: Secondary | ICD-10-CM | POA: Diagnosis not present

## 2019-05-03 ENCOUNTER — Encounter: Payer: Self-pay | Admitting: Family Medicine

## 2019-05-03 ENCOUNTER — Ambulatory Visit (INDEPENDENT_AMBULATORY_CARE_PROVIDER_SITE_OTHER): Payer: 59 | Admitting: Family Medicine

## 2019-05-03 VITALS — BP 110/80 | HR 84 | Ht 73.0 in | Wt 199.4 lb

## 2019-05-03 DIAGNOSIS — M21371 Foot drop, right foot: Secondary | ICD-10-CM

## 2019-05-03 DIAGNOSIS — R2681 Unsteadiness on feet: Secondary | ICD-10-CM

## 2019-05-03 DIAGNOSIS — M5416 Radiculopathy, lumbar region: Secondary | ICD-10-CM | POA: Diagnosis not present

## 2019-05-03 MED ORDER — TRAMADOL HCL 50 MG PO TABS: 50.0000 mg | ORAL_TABLET | Freq: Three times a day (TID) | ORAL | 0 refills | Status: DC | PRN

## 2019-05-03 MED ORDER — GABAPENTIN 300 MG PO CAPS: ORAL_CAPSULE | ORAL | 3 refills | Status: DC

## 2019-05-03 NOTE — Progress Notes (Signed)
MRI L-spine shows significantly pinched nerves in the low back and a little further up in the mid back causing the symptoms that you are having including pain and weakness.  Typically we will try treating this with injections.  However your symptoms are bad enough that surgery may be reasonable.  Recommend return to clinic to discuss the MRI results in detail and look at the pictures together.  In addition we will discuss what next steps are.

## 2019-05-03 NOTE — Patient Instructions (Addendum)
Thank you for coming in today. You should hear from the neurosurgeon.  Let me know if you do not hear soon.  Use tramadol sparingly for severe pain.  Really try gabapentin.  Sometimes this helps a lot.   Let me know if you do not hear anything soon.    Surgical Spinal Decompression Spinal decompression is a surgery to create more space for the spinal cord. It is done to relieve pressure on the spinal cord and nerves in the spine (spinal nerves) when that pressure causes symptoms, such as:  Severe pain.  Weakness.  Numbness.  Trouble emptying one's bladder or bowel.  Trouble controlling one's bladder or bowel (incontinence). There are several types of spinal decompression. They include:  Laminectomy. This type is done to remove the bony arch at the back of the bones of the spine (vertebrae), which forms the spinal canal.  Diskectomy. This type is done to remove a disk between vertebrae.  Microdiskectomy. This type is done to remove part of a spinal disk.  Foraminotomy. This type is done to widen the bony passage that spinal nerves pass through.  Corpectomy or vertebrectomy. This type is done to remove a vertebra. If the spinal decompression makes the spine unstable, spinal decompression may be done along with a procedure to make two or more vertebrae grow together (spinal fusion). Tell a health care provider about:  Any allergies you have.  All medicines you are taking, including vitamins, herbs, eye drops, creams, and over-the-counter medicines.  Any problems you or family members have had with anesthetic medicines.  Any blood disorders you have.  Any surgeries you have had.  Any medical conditions you have.  Whether you are pregnant or may be pregnant. What are the risks? Generally, this is a safe procedure. However, problems may occur, including:  Bleeding.  Infection.  Allergic reactions to medicines or dyes.  Damage to other structures or organs, such as  nerves or the spinal cord.  A blood clot that forms in the leg and travels to the lung (pulmonary embolism).  Failure to relieve your symptoms.  Need for more surgery. What happens before the procedure? Staying hydrated Follow instructions from your health care provider about hydration, which may include:  Up to 2 hours before the procedure - you may continue to drink clear liquids, such as water, clear fruit juice, black coffee, and plain tea. Eating and drinking restrictions Follow instructions from your health care provider about eating and drinking, which may include:  8 hours before the procedure - stop eating heavy meals or foods such as meat, fried foods, or fatty foods.  6 hours before the procedure - stop eating light meals or foods, such as toast or cereal.  6 hours before the procedure - stop drinking milk or drinks that contain milk.  2 hours before the procedure - stop drinking clear liquids. Medicines  Ask your health care provider about: ? Changing or stopping your regular medicines. This is especially important if you are taking diabetes medicines or blood thinners. ? Taking medicines such as aspirin and ibuprofen. These medicines can thin your blood. Do not take these medicines unless your health care provider tells you to take them. ? Taking over-the-counter medicines, vitamins, herbs, and supplements. General instructions  Starting one month or more before surgery, do not use any products that contain nicotine or tobacco. These include cigarettes and e-cigarettes. If you need help quitting, ask your health care provider.  You may have an imaging study  of your spine, such as an MRI or CT scan, to help plan the procedure.  You may have a test called a diskogram. In this test, a dye is injected into your back before X-rays are taken.  Plan to have someone take you home from the hospital.  Plan to have a responsible adult care for you for at least 24 hours after  you leave the hospital. This is important.  Ask your health care team what steps will be taken to prevent infection. These may include: ? Removing hair at the surgery site, if needed. ? Washing the skin with germ-killing soap. ? Antibiotic medicine. What happens during the procedure?  An IV will be inserted into one of your veins.  You will be given one or more of the following: ? A medicine to help you relax (sedative). ? A medicine to numb the area (local anesthetic). ? A medicine to make you fall asleep (general anesthetic). ? A medicine that is injected into your spine to numb the area below and slightly above the injection site (spinal anesthetic).  The surgeon will make an incision near your spine. If the affected part of the spine is in the neck, the incision may be made in the front or back of the neck. The length of the incision will depend on how many vertebrae and disks are affected and whether spinal fusion will be needed.  Your surgeon may move muscles and nerves so the affected part of the spine can be seen easily.  The surgeon will do the appropriate type of spinal decompression.  If needed, a spinal fusion will be done.  The muscles and nerves will be put back in their normal position.  The incision will be closed with stitches (sutures) or staples.  A small drain may be placed close to your incision to prevent fluid or blood from pooling in your incision. The drain will be removed within 1-2 days.  A bandage (dressing) will be placed over the incision. The procedure may vary among health care providers and hospitals. What happens after the procedure?  Your blood pressure, heart rate, breathing rate, and blood oxygen level will be monitored until you leave the hospital or clinic.  Your IV may be removed when you are able to drink fluids on your own.  You will receive pain medicine as needed.  You will be encouraged to get up and walk around as soon as you  can.  You may have to wear compression stockings. These stockings help to prevent blood clots and reduce swelling in your legs. Summary  Spinal decompression is a surgery to create more space for the spinal cord.  The surgery may be done to relieve symptoms such as severe pain, weakness, numbness, or difficulty controlling one's bowel or bladder (incontinence).  Before the procedure, you will need to make plans to have a responsible adult care for you for at least 24 hours after you leave the hospital. This information is not intended to replace advice given to you by your health care provider. Make sure you discuss any questions you have with your health care provider. Document Revised: 02/21/2017 Document Reviewed: 02/05/2017 Elsevier Patient Education  2020 [employer].

## 2019-05-03 NOTE — Progress Notes (Signed)
I, Thomas Rocha, LAT, ATC, am serving as scribe for Dr. Lynne Leader.  Thomas Rocha is a 49 y.o. male who presents to [facility] at [facility] today for f/u of low back pain and R LE pain and tingling in his R toes w/ R LE weakness and foot drop.  Pt is here to review his L-spine MRI results.  He last saw Dr. Georgina Snell on 04/19/19 and had a L-spine XR and was referred for an L-spine MRI.  He was also prescribed Gabapentin.  Since his last visit w/ Dr. Georgina Snell, pt reports that he is feeling about the same/slightly worse.  He is also reporting B shoulder pain.  He states that he put some Tiger Balm on his B shoulders.  He states that he uses a lot of OTC meds to control his pain.   Pertinent review of systems: no fever or chills  Relevant historical information: History cervical spine laminectomy [facility].  Family history lumbar decompression and fusion in sister   Exam:  BP 110/80 (BP Location: Left Arm, Patient Position: Sitting, Cuff Size: Large)   Pulse 84   Ht 6\' 1"  (1.854 m)   Wt 199 lb 6.4 oz (90.4 kg)   SpO2 97%   BMI 26.31 kg/m  General: Well Developed, well nourished, and in no acute distress.   MSK:  L-spine: Nontender spinal midline.  Decreased lumbar motion to extension. Continued dropfoot gait.  Foot dorsiflexion continues to be weak bilaterally.    Lab and Radiology Results No results found for this or any previous visit (from the past 72 hour(s)). MR Lumbar Spine Wo Contrast  Result Date: 05/02/2019 CLINICAL DATA:  Low back pain radiating into the bilateral legs EXAM: MRI LUMBAR SPINE WITHOUT CONTRAST TECHNIQUE: Multiplanar, multisequence MR imaging of the lumbar spine was performed. No intravenous contrast was administered. COMPARISON:  None. FINDINGS: Segmentation:  5 lumbar type vertebrae Alignment: Facet mediated grade 1 anterolisthesis at L4-5. Slight retrolisthesis at L3-4 Vertebrae: Discogenic marrow edema about the L4-5 disc space  posteriorly, mild. No evidence of fracture or bone lesion. Conus medullaris and cauda equina: Conus extends to the T12-L1 level. The lower cord is compressed by degenerative disease at T11-12, with T2 hyperintensity. Fatty filum terminalis. Paraspinal and other soft tissues: Partially covered right renal cystic intensity. Disc levels: T11-12: Spondylosis and disc narrowing with central protrusion compressing the cord which is T2 hyperintense. The cord also appears atrophic at this level. T12- L1: Spondylosis with disc narrowing and bulging. Negative facets. Mild bilateral foraminal narrowing L1-L2: Unremarkable. L2-L3: Mild disc bulging.  No impingement L3-L4: Disc narrowing and bulging with endplate spurring. Facet hypertrophy. Mild spinal and bilateral foraminal stenosis. L4-L5: Severe facet arthropathy with spurring and anterolisthesis. The disc is also bulging with annular fissures and biforaminal neural compression.Spinal stenosis is advanced. L5-S1:Disc narrowing and bulging with left paracentral to foraminal protrusion and annular fissure. There is prominent facet osteoarthritis with anteriorly projecting left-sided synovial cyst measuring 5 mm. The canal is patent. Left foraminal impingement. IMPRESSION: 1. 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 2. L4-5 severe facet arthropathy with anterolisthesis and disc degeneration causing advanced spinal and biforaminal impingement. 3. L5-S1 degenerative left foraminal impingement due to disc herniation and facet spurring with synovial cyst. 4. L3-4 mild spinal stenosis. Electronically Signed   By: Monte Fantasia M.D.   On: 05/02/2019 15:56   I, Lynne Leader, personally (independently) visualized and performed the interpretation of the images attached in this note.  Assessment and Plan: 49 y.o. male with low back pain and lumbar radiculopathy bilateral lower extremity weakness.  MRI shows significant neuroforaminal stenosis and  spinal stenosis at L4-L5.  Additionally he has some mild cord compression at T11-T12.  Given the severity of his symptoms and his weakness with the changes seen above I think best next step for [name] is very likely going to be surgical.  I think it is likely that epidural steroid injections will not not provide significant lasting relief.  Plan to refer to neurosurgery for discussion of surgical options. Gabapentin and tramadol for pain control.   PDMP reviewed during this encounter. Orders Placed This Encounter  Procedures  . Ambulatory referral to Neurosurgery    Referral Priority:   Routine    Referral Type:   Surgical    Referral Reason:   Specialty Services Required    Requested Specialty:   Neurosurgery    Number of Visits Requested:   1   Meds ordered this encounter  Medications  . traMADol (ULTRAM) 50 MG tablet    Sig: Take 1 tablet (50 mg total) by mouth every 8 (eight) hours as needed for severe pain.    Dispense:  15 tablet    Refill:  0  . gabapentin (NEURONTIN) 300 MG capsule    Sig: One tab PO qHS for a week, then BID for a week, then TID. May double weekly to a max of 3,600mg /day    Dispense:  180 capsule    Refill:  3     Discussed warning signs or symptoms. Please see discharge instructions. Patient expresses understanding.   The above documentation has been reviewed and is accurate and complete Lynne Leader

## 2019-05-11 ENCOUNTER — Telehealth: Payer: Self-pay

## 2019-05-11 NOTE — Telephone Encounter (Signed)
Called pt and relayed that he can just go to neurosurgery and doesn't need to go to neurology.

## 2019-05-11 NOTE — Telephone Encounter (Signed)
Patient called and stated he was referred to neurologist by PCP but has since seen Dr. Georgina Snell and referred to neurosurgery. Patient wanting to know if he still needs to see the neurologist. Patient does not want to waste his time going to neurology if its not needed. Would like a call back.

## 2019-05-11 NOTE — Telephone Encounter (Signed)
Neurosurgery/orthopedic spine surgery is probably the best bet.  Okay to hold off on neurology for now.

## 2019-05-12 ENCOUNTER — Other Ambulatory Visit: Payer: Self-pay | Admitting: Family Medicine

## 2019-05-12 MED ORDER — TRAMADOL HCL 50 MG PO TABS: 50.0000 mg | ORAL_TABLET | Freq: Three times a day (TID) | ORAL | 0 refills | Status: DC | PRN

## 2019-05-12 NOTE — Telephone Encounter (Signed)
Received fax refill request from [facility] for tramadol.  Medication refilled.

## 2019-05-12 NOTE — Telephone Encounter (Signed)
Patient called stating that he contacted [facility] and he said he was given the following providers that are in network.[facility]:  Dr. Estrella Deeds Dr. Lurline Del Or  [facility]: Dr. Vickey Sages

## 2019-05-12 NOTE — Progress Notes (Deleted)
NEUROLOGY CONSULTATION NOTE  Thomas Rocha MRN: [id number] DOB: Aug 11, 1970  Referring provider: Libby Maw, MD Primary care provider: Libby Maw, MD  Reason for consult:  Unsteady gait  HISTORY OF PRESENT ILLNESS: Thomas Rocha is a 49 year old male with history of *** laminectomy and substance abuse who presents for unsteady gait.    He has history of chronic low back pain for the past *** years, with radicular pain and numbness radiating down his right ***.  He also reports associated weakness *** and often trips.  He also endorses increased urinary urgency.  He has *** and ***.  He feels weak in his shoulders.  He was previously diagnosed with carpal tunnel syndrome ***.  He was evaluated by Dr. Dorena Bodo of Sports Medicine and was noted to have bilateral foot drop (right worse than left).  MRI of lumbar spine on 05/02/2019 personally reviewed significant degenerative spinal stenosis with cord compression and associated myelomalacia at T11-T12, advanced spinal and biforaminal stenosis at L4-L5, and left foraminal stenosis at L5-S1.  He was referred to ***  05/02/2019:  MRI LUMBAR SPINE WITHOUT CONTRAST:  Segmentation:  5 lumbar type vertebrae.  Alignment: Facet mediated grade 1 anterolisthesis at L4-5. Slight retrolisthesis at L3-4.  Vertebrae: Discogenic marrow edema about the L4-5 disc space posteriorly, mild. No evidence of fracture or bone lesion.  Conus medullaris and cauda equina: Conus extends to the T12-L1 level. The lower cord is compressed by degenerative disease atT11-12, with T2 hyperintensity. Fatty filum terminalis.  Paraspinal and other soft tissues: Partially covered right renal cystic intensity.  Disc levels:  T11-12: Spondylosis and disc narrowing with central protrusion compressing the cord which is T2 hyperintense. The cord also appears atrophic at this level.  T12- L1: Spondylosis with disc narrowing and bulging. Negative facets. Mild bilateral  foraminal narrowing.  L1-L2: Unremarkable.  L2-L3: Mild disc bulging.  No impingement.  L3-L4: Disc narrowing and bulging with endplate spurring. Facet hypertrophy. Mild spinal and bilateral foraminal stenosis.  L4-L5: Severe facet arthropathy with spurring and anterolisthesis.  The disc is also bulging with annular fissures and biforaminal neural compression.Spinal stenosis is advanced.  L5-S1:Disc narrowing and bulging with left paracentral to foraminal protrusion and annular fissure. There is prominent facet osteoarthritis with anteriorly projecting left-sided synovial cyst measuring 5 mm. The canal is patent. Left foraminal impingement.  IMPRESSION:  1. 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  2. L4-5 severe facet arthropathy with anterolisthesis and disc degeneration causing advanced spinal and biforaminal impingement.  3. L5-S1 degenerative left foraminal impingement due to disc herniation and facet spurring with synovial cyst.  4. L3-4 mild spinal stenosis.  ***  PAST MEDICAL HISTORY: No past medical history on file.  PAST SURGICAL HISTORY: Past Surgical History:  Procedure Laterality Date  . CERVICAL LAMINECTOMY    . HERNIA REPAIR      MEDICATIONS: Current Outpatient Medications on File Prior to Visit  Medication Sig Dispense Refill  . acetaminophen (TYLENOL) 500 MG tablet Take 500 mg by mouth every 6 (six) hours as needed for mild pain.    Marland Kitchen AMBULATORY NON FORMULARY MEDICATION Ankle Foot Orthosis for right leg foot drop.  Disp 1. M21.371 1 each 0  . gabapentin (NEURONTIN) 300 MG capsule One tab PO qHS for a week, then BID for a week, then TID. May double weekly to a max of 3,600mg /day 180 capsule 3  . naproxen (NAPROSYN) 500 MG tablet Take 1 tablet (500 mg total) by mouth 2 (two)  times daily. (Patient not taking: Reported on 04/08/2019) 30 tablet 0  . sildenafil (REVATIO) 20 MG tablet May take up to 3 daily 45 minutes prior 10 tablet 1  . traMADol (ULTRAM) 50  MG tablet Take 1 tablet (50 mg total) by mouth every 8 (eight) hours as needed for severe pain. 15 tablet 0   No current facility-administered medications on file prior to visit.    ALLERGIES: No Known Allergies  FAMILY HISTORY: Family History  Problem Relation Age of Onset  . Cancer Mother   . Diabetes Maternal Grandmother   . Hypertension Maternal Grandmother    ***.  SOCIAL HISTORY: Social History   Socioeconomic History  . Marital status: Single    Spouse name: Not on file  . Number of children: Not on file  . Years of education: Not on file  . Highest education level: Not on file  Occupational History  . Not on file  Tobacco Use  . Smoking status: Current Every Day Smoker    Packs/day: 1.00    Types: Cigarettes  . Smokeless tobacco: Never Used  Substance and Sexual Activity  . Alcohol use: Yes    Alcohol/week: 5.0 standard drinks    Types: 5 Cans of beer per week    Comment: 5 beers three timies a week   . Drug use: Yes    Types: Cocaine, Marijuana    Comment: last used cocaine 5 days ago   . Sexual activity: Yes  Other Topics Concern  . Not on file  Social History Narrative  . Not on file   Social Determinants of Health   Financial Resource Strain:   . Difficulty of Paying Living Expenses: Not on file  Food Insecurity:   . Worried About Charity fundraiser in the Last Year: Not on file  . Ran Out of Food in the Last Year: Not on file  Transportation Needs:   . Lack of Transportation (Medical): Not on file  . Lack of Transportation (Non-Medical): Not on file  Physical Activity:   . Days of Exercise per Week: Not on file  . Minutes of Exercise per Session: Not on file  Stress:   . Feeling of Stress : Not on file  Social Connections:   . Frequency of Communication with Friends and Family: Not on file  . Frequency of Social Gatherings with Friends and Family: Not on file  . Attends Religious Services: Not on file  . Active Member of Clubs or  Organizations: Not on file  . Attends Archivist Meetings: Not on file  . Marital Status: Not on file  Intimate Partner Violence:   . Fear of Current or Ex-Partner: Not on file  . Emotionally Abused: Not on file  . Physically Abused: Not on file  . Sexually Abused: Not on file    REVIEW OF SYSTEMS: Constitutional: No fevers, chills, or sweats, no generalized fatigue, change in appetite Eyes: No visual changes, double vision, eye pain Ear, nose and throat: No hearing loss, ear pain, nasal congestion, sore throat Cardiovascular: No chest pain, palpitations Respiratory:  No shortness of breath at rest or with exertion, wheezes GastrointestinaI: No nausea, vomiting, diarrhea, abdominal pain, fecal incontinence Genitourinary:  No dysuria, urinary retention or frequency Musculoskeletal:  No neck pain, back pain Integumentary: No rash, pruritus, skin lesions Neurological: as above Psychiatric: No depression, insomnia, anxiety Endocrine: No palpitations, fatigue, diaphoresis, mood swings, change in appetite, change in weight, increased thirst Hematologic/Lymphatic:  No purpura, petechiae. Allergic/Immunologic:  no itchy/runny eyes, nasal congestion, recent allergic reactions, rashes  PHYSICAL EXAM: *** General: No acute distress.  Patient appears ***-groomed.  *** Head:  Normocephalic/atraumatic Eyes:  fundi examined but not visualized Neck: supple, no paraspinal tenderness, full range of motion Back: No paraspinal tenderness Heart: regular rate and rhythm Lungs: Clear to auscultation bilaterally. Vascular: No carotid bruits. Neurological Exam: Mental status: alert and oriented to person, place, and time, recent and remote memory intact, fund of knowledge intact, attention and concentration intact, speech fluent and not dysarthric, language intact. Cranial nerves: CN I: not tested CN II: pupils equal, round and reactive to light, visual fields intact CN III, IV, VI:  full  range of motion, no nystagmus, no ptosis CN V: facial sensation intact CN VII: upper and lower face symmetric CN VIII: hearing intact CN IX, X: gag intact, uvula midline CN XI: sternocleidomastoid and trapezius muscles intact CN XII: tongue midline Bulk & Tone: normal, no fasciculations. Motor:  5/5 throughout *** Sensation:  Pinprick *** temperature *** and vibration sensation intact.  ***. Deep Tendon Reflexes:  2+ throughout, *** toes downgoing.  *** Finger to nose testing:  Without dysmetria.  *** Heel to shin:  Without dysmetria.  *** Gait:  Normal station and stride.  Able to turn and tandem walk. Romberg ***.  IMPRESSION: ***  PLAN: ***  Thank you for allowing me to take part in the care of this patient.  Metta Clines, DO  CC: ***

## 2019-05-13 ENCOUNTER — Other Ambulatory Visit: Payer: Self-pay | Admitting: Family Medicine

## 2019-05-13 DIAGNOSIS — N5201 Erectile dysfunction due to arterial insufficiency: Secondary | ICD-10-CM

## 2019-05-14 ENCOUNTER — Ambulatory Visit: Payer: 59 | Admitting: Neurology

## 2019-05-17 NOTE — Telephone Encounter (Signed)
Called pt on Friday, 05/14/19 and thanked him for getting [place] the info regarding neurosurgeons that accept his insurance. Inform pt that a referral has been sent to [facility] so he should anticipate a call from that office to schedule.  Pt verbalizes understanding.

## 2019-05-24 ENCOUNTER — Other Ambulatory Visit: Payer: Self-pay | Admitting: Family Medicine

## 2019-05-24 MED ORDER — TRAMADOL HCL 50 MG PO TABS: 50.0000 mg | ORAL_TABLET | Freq: Three times a day (TID) | ORAL | 0 refills | Status: DC | PRN

## 2019-05-24 NOTE — Telephone Encounter (Signed)
Received fax refill request for tramadol.  Medication sent to pharmacy.

## 2019-05-25 ENCOUNTER — Telehealth: Payer: Self-pay

## 2019-05-25 NOTE — Telephone Encounter (Signed)
Patient called wanting to talk to Dr. Georgina Snell in regard to how long he would be out from the surgery?

## 2019-05-26 NOTE — Telephone Encounter (Signed)
Called pt and relayed Dr. Clovis Riley advice.  Pt states that he has an appt w/ a doctor at [facility] Neuro[facility] on June 07, 2019 and will ask specific questions regarding recovery from potential spine surgery at that time.

## 2019-05-26 NOTE — Telephone Encounter (Signed)
It is difficult to predict how long you would be out from surgery.  I would estimate at least a month.  However the surgeon will be able to give you a better idea of exactly how long he will be out.  Recommend having a consultation with the surgeon discussing it with them.

## 2019-06-02 ENCOUNTER — Other Ambulatory Visit: Payer: Self-pay | Admitting: Family Medicine

## 2019-06-02 NOTE — Telephone Encounter (Signed)
I believe this is for you.

## 2019-06-11 ENCOUNTER — Other Ambulatory Visit: Payer: Self-pay | Admitting: Family Medicine

## 2019-06-24 ENCOUNTER — Other Ambulatory Visit: Payer: Self-pay | Admitting: Family Medicine

## 2019-07-07 ENCOUNTER — Other Ambulatory Visit: Payer: Self-pay | Admitting: Family Medicine

## 2019-07-13 ENCOUNTER — Telehealth: Payer: Self-pay | Admitting: Family Medicine

## 2019-07-13 NOTE — Telephone Encounter (Signed)
Pt is trying to get his Tramadol filled, but [facility] is stating a possible PA is needed.

## 2019-07-14 MED ORDER — TRAMADOL HCL 50 MG PO TABS: ORAL_TABLET | ORAL | 0 refills | Status: DC

## 2019-07-14 NOTE — Telephone Encounter (Signed)
Tramadol refilled.

## 2019-07-22 ENCOUNTER — Other Ambulatory Visit: Payer: Self-pay | Admitting: Family Medicine

## 2019-07-28 ENCOUNTER — Other Ambulatory Visit: Payer: Self-pay

## 2019-07-28 NOTE — Telephone Encounter (Signed)
Patient would like to talk to dr corey in regard to medication and that his surgery was denied and is trying to appeal it. Patient also states only getting 8 pills each refills.

## 2019-07-29 MED ORDER — TRAMADOL HCL 50 MG PO TABS: ORAL_TABLET | ORAL | 0 refills | Status: DC

## 2019-08-05 ENCOUNTER — Other Ambulatory Visit: Payer: Self-pay | Admitting: Family Medicine

## 2019-08-25 ENCOUNTER — Telehealth: Payer: Self-pay | Admitting: Family Medicine

## 2019-08-25 NOTE — Telephone Encounter (Signed)
Pt needs refill of Tramadol to [facility]. FYI surgery date 09/02/2019

## 2019-08-26 MED ORDER — TRAMADOL HCL 50 MG PO TABS: ORAL_TABLET | ORAL | 0 refills | Status: DC

## 2019-08-26 NOTE — Telephone Encounter (Signed)
Pt informed via VM

## 2019-08-26 NOTE — Telephone Encounter (Signed)
Tramadol refilled.

## 2019-09-02 DIAGNOSIS — M4316 Spondylolisthesis, lumbar region: Secondary | ICD-10-CM | POA: Insufficient documentation

## 2019-09-02 HISTORY — PX: LUMBAR LAMINECTOMY: SHX95

## 2019-09-02 HISTORY — PX: LUMBAR FUSION: SHX111

## 2019-09-03 ENCOUNTER — Other Ambulatory Visit: Payer: Self-pay | Admitting: Family Medicine

## 2019-11-22 ENCOUNTER — Telehealth: Payer: Self-pay | Admitting: Family Medicine

## 2019-11-22 NOTE — Telephone Encounter (Signed)
Patient dropped off disability parking paperwork. It is in Dr. Bebe Shaggy folder in front office to be signed.

## 2019-11-23 NOTE — Telephone Encounter (Signed)
For received and waiting to be signed by doctor.

## 2019-12-08 NOTE — Telephone Encounter (Signed)
Patient is calling to check the status of his forms. Please give him a call back and update at [phone].

## 2019-12-09 NOTE — Telephone Encounter (Signed)
Called patient to inform that form was ready for pick up, no answer unable to leave a message.

## 2020-05-04 ENCOUNTER — Other Ambulatory Visit: Payer: Self-pay

## 2020-05-05 ENCOUNTER — Encounter: Payer: Self-pay | Admitting: Family Medicine

## 2020-05-05 ENCOUNTER — Ambulatory Visit (INDEPENDENT_AMBULATORY_CARE_PROVIDER_SITE_OTHER): Payer: 59 | Admitting: Family Medicine

## 2020-05-05 VITALS — BP 124/78 | HR 86 | Temp 97.5°F | Ht 72.0 in | Wt 213.8 lb

## 2020-05-05 DIAGNOSIS — N5201 Erectile dysfunction due to arterial insufficiency: Secondary | ICD-10-CM

## 2020-05-05 DIAGNOSIS — M4316 Spondylolisthesis, lumbar region: Secondary | ICD-10-CM

## 2020-05-05 MED ORDER — SILDENAFIL CITRATE 100 MG PO TABS: 50.0000 mg | ORAL_TABLET | Freq: Every day | ORAL | 11 refills | Status: DC | PRN

## 2020-05-05 NOTE — Progress Notes (Signed)
[facility] [address] [place] [address] Dept: [phone] Dept Fax: [phone]  Acute Office Visit  Subjective:    Patient ID: Thomas Rocha, male    DOB: [birth year], 50 y.o..   MRN: [id number]  Chief Complaint  Patient presents with  . Follow-up    Follow up patient needing new placement handicap card. Discuss ED    History of Present Illness:  Patient is in today requesting renewal of his disabled parking placard. Mr. Cuccaro has a history of lumbar spondylolisthesis, lumber radiculopathy, and a right foot droop. He had surgery in June 2021. He has completed physical therapy. He continues to follow with neurosurgery. 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 He notes that he can walk for short distances without rest, but for longer distances he continues to require a cane for assistance.  Mr. Wynetta Emery has a prior issue with ED. He notes he was prescribed sildenafil 20 mg 3 tabs for this. He does not find this dose to be effective at this point. He would like to try a higher dose.  Past Medical History: Patient Active Problem List   Diagnosis Date Noted  . Spondylolisthesis of lumbar region 09/02/2019  . Foot drop, right 04/20/2019  . Erectile dysfunction due to arterial insufficiency 04/16/2019  . Healthcare maintenance 04/16/2019  . Lumbar radiculopathy 04/08/2019  . Gait instability 04/08/2019   Past Surgical History:  Procedure Laterality Date  . CERVICAL LAMINECTOMY    . HERNIA REPAIR    . LUMBAR FUSION Bilateral 09/02/2019   L4-L5  . LUMBAR LAMINECTOMY Bilateral 09/02/2019   Family History  Problem Relation Age of Onset  . Cancer Mother   . Diabetes Maternal Grandmother   . Hypertension Maternal Grandmother    Outpatient Medications Prior to Visit  Medication Sig Dispense Refill  . acetaminophen (TYLENOL) 500 MG tablet Take 500 mg by mouth  every 6 (six) hours as needed for mild pain.    Marland Kitchen AMBULATORY NON FORMULARY MEDICATION Ankle Foot Orthosis for right leg foot drop.  Disp 1. M21.371 1 each 0  . gabapentin (NEURONTIN) 300 MG capsule One tab PO qHS for a week, then BID for a week, then TID. May double weekly to a max of 3,600mg /day 180 capsule 3  . Misc. Devices MISC AFO for left foot    . tiZANidine (ZANAFLEX) 4 MG tablet Take 4 mg by mouth 3 (three) times daily.    . DULoxetine (CYMBALTA) 30 MG capsule Take by mouth.    . naproxen (NAPROSYN) 500 MG tablet Take 1 tablet (500 mg total) by mouth 2 (two) times daily. (Patient not taking: No sig reported) 30 tablet 0  . sildenafil (REVATIO) 20 MG tablet TAKE UP TO 3 TABLETS BY MOUTH DAILY 45 MINUTES BEFORE INTERCOURSE (Patient not taking: Reported on 05/05/2020) 30 tablet 1  . traMADol (ULTRAM) 50 MG tablet TAKE 1 TABLET(50 MG) BY MOUTH EVERY 8 HOURS AS NEEDED FOR SEVERE PAIN (Patient not taking: Reported on 05/05/2020) 15 tablet 0   No facility-administered medications prior to visit.   No Known Allergies    Objective:   Today's Vitals   05/05/20 1314  BP: 124/78  Pulse: 86  Temp: (!) 97.5 F (36.4 C)  TempSrc: Temporal  SpO2: 97%  Weight: 213 lb 12.8 oz (97 kg)  Height: 6' (1.829 m)   Body mass index is 29 kg/m.   General: Well developed, well  nourished. No acute distress. Psych: Alert and oriented. Normal mood and affect.  Health Maintenance Due  Topic Date Due  . Hepatitis C Screening  Never done  . COLONOSCOPY (Pts 45-23yrs Insurance coverage will need to be confirmed)  Never done     Assessment & Plan:   1. Spondylolisthesis of lumbar region Disabled Parking Placard Application completed. Continue to follow with neurosurgery and keep appointment with Vocational Rehab.  2. Erectile dysfunction due to arterial insufficiency We will try full strength Viagra to see if this gives the patient a positive effect.  - sildenafil (VIAGRA) 100 MG tablet; Take 0.5-1  tablets (50-100 mg total) by mouth daily as needed for erectile dysfunction.  Dispense: 5 tablet; Refill: 11  Haydee Salter, MD

## 2020-05-09 ENCOUNTER — Telehealth: Payer: Self-pay | Admitting: Family Medicine

## 2020-05-09 DIAGNOSIS — N5201 Erectile dysfunction due to arterial insufficiency: Secondary | ICD-10-CM

## 2020-05-09 NOTE — Telephone Encounter (Signed)
Forgot to put medication, sildenafil (VIAGRA) 100 MG tablet

## 2020-05-09 NOTE — Telephone Encounter (Signed)
Pt said that [employer] called him and said that they wont cover med without talking to provider, please advise. [phone]

## 2020-05-12 NOTE — Telephone Encounter (Signed)
PA placed waiting for determination patient aware.

## 2020-05-16 ENCOUNTER — Encounter (HOSPITAL_COMMUNITY): Payer: Self-pay | Admitting: Emergency Medicine

## 2020-05-16 ENCOUNTER — Emergency Department (HOSPITAL_COMMUNITY): Payer: 59

## 2020-05-16 ENCOUNTER — Other Ambulatory Visit: Payer: Self-pay

## 2020-05-16 ENCOUNTER — Emergency Department (HOSPITAL_COMMUNITY)
Admission: EM | Admit: 2020-05-16 | Discharge: 2020-05-17 | Disposition: A | Discharge status: Home / Self Care | Payer: 59 | Attending: Emergency Medicine | Admitting: Emergency Medicine

## 2020-05-16 DIAGNOSIS — Y92410 Unspecified street and highway as the place of occurrence of the external cause: Secondary | ICD-10-CM | POA: Diagnosis not present

## 2020-05-16 DIAGNOSIS — S299XXA Unspecified injury of thorax, initial encounter: Secondary | ICD-10-CM | POA: Insufficient documentation

## 2020-05-16 DIAGNOSIS — S0990XA Unspecified injury of head, initial encounter: Secondary | ICD-10-CM | POA: Insufficient documentation

## 2020-05-16 DIAGNOSIS — F17210 Nicotine dependence, cigarettes, uncomplicated: Secondary | ICD-10-CM | POA: Insufficient documentation

## 2020-05-16 DIAGNOSIS — R0781 Pleurodynia: Secondary | ICD-10-CM

## 2020-05-16 DIAGNOSIS — S199XXA Unspecified injury of neck, initial encounter: Secondary | ICD-10-CM | POA: Insufficient documentation

## 2020-05-16 DIAGNOSIS — M542 Cervicalgia: Secondary | ICD-10-CM

## 2020-05-16 NOTE — ED Notes (Signed)
Dr. Ralene Bathe notified of pt and orders received.
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
## 2020-05-17 MED ORDER — KETOROLAC TROMETHAMINE 60 MG/2ML IM SOLN
30.0000 mg | Freq: Once | INTRAMUSCULAR | Status: AC
  Administered 2020-05-17: 30 mg via INTRAMUSCULAR
  Filled 2020-05-17: qty 2

## 2020-05-17 MED ORDER — OXYCODONE-ACETAMINOPHEN 5-325 MG PO TABS
2.0000 | ORAL_TABLET | Freq: Once | ORAL | Status: AC
  Administered 2020-05-17: 2 via ORAL
  Filled 2020-05-17: qty 2

## 2020-05-17 MED ORDER — IBUPROFEN 800 MG PO TABS: 800.0000 mg | ORAL_TABLET | Freq: Three times a day (TID) | ORAL | 0 refills | Status: DC

## 2020-05-17 MED ORDER — OXYCODONE-ACETAMINOPHEN 5-325 MG PO TABS: 2.0000 | ORAL_TABLET | ORAL | 0 refills | Status: DC | PRN

## 2020-05-17 NOTE — ED Notes (Signed)
Patient verbalized understanding of discharge instructions. Opportunity for questions and answers.  
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
## 2020-05-18 ENCOUNTER — Encounter: Payer: 59 | Admitting: Family Medicine

## 2020-05-29 NOTE — Telephone Encounter (Signed)
Additional information required for PA for the Sildenafil.  Information faxed back to [place] @ [phone].  Dm/cma

## 2020-05-31 NOTE — Telephone Encounter (Signed)
PA denied due to not meeting requirements of one of the following: 1. Organic condition 2. Medication-induced 3. Psychogenic condition  Reference [id number]  Is there a different medication he can take or any of the above conditions that can be used?  Please advise.  Thanks.  Dm/cma

## 2020-06-12 MED ORDER — SILDENAFIL CITRATE 100 MG PO TABS: 50.0000 mg | ORAL_TABLET | Freq: Every day | ORAL | 11 refills | Status: DC | PRN

## 2020-06-12 NOTE — Addendum Note (Signed)
Addended by: Haydee Salter on: 06/12/2020 01:23 PM   Modules accepted: Orders

## 2020-07-10 ENCOUNTER — Telehealth: Payer: Self-pay | Admitting: Family Medicine

## 2020-07-10 NOTE — Telephone Encounter (Signed)
Okay for referral to GI for colonoscopy?

## 2020-07-10 NOTE — Telephone Encounter (Signed)
yes

## 2020-07-10 NOTE — Telephone Encounter (Signed)
Pt called and asked if a referral can be put in for him to get a colonoscopy

## 2020-07-11 ENCOUNTER — Other Ambulatory Visit: Payer: Self-pay

## 2020-07-11 ENCOUNTER — Encounter: Payer: 59 | Admitting: Family Medicine

## 2020-07-11 DIAGNOSIS — Z1211 Encounter for screening for malignant neoplasm of colon: Secondary | ICD-10-CM

## 2020-07-11 NOTE — Telephone Encounter (Signed)
Referral placed patient aware  

## 2020-07-31 ENCOUNTER — Encounter: Payer: 59 | Admitting: Family Medicine

## 2020-08-22 ENCOUNTER — Other Ambulatory Visit: Payer: Self-pay

## 2020-08-23 ENCOUNTER — Ambulatory Visit (INDEPENDENT_AMBULATORY_CARE_PROVIDER_SITE_OTHER): Payer: 59 | Admitting: Family Medicine

## 2020-08-23 ENCOUNTER — Encounter: Payer: Self-pay | Admitting: Family Medicine

## 2020-08-23 VITALS — BP 118/70 | HR 69 | Temp 96.9°F | Ht 72.0 in | Wt 201.8 lb

## 2020-08-23 DIAGNOSIS — R4189 Other symptoms and signs involving cognitive functions and awareness: Secondary | ICD-10-CM

## 2020-08-23 DIAGNOSIS — Z0000 Encounter for general adult medical examination without abnormal findings: Secondary | ICD-10-CM

## 2020-08-23 DIAGNOSIS — F339 Major depressive disorder, recurrent, unspecified: Secondary | ICD-10-CM | POA: Diagnosis not present

## 2020-08-23 DIAGNOSIS — R8290 Unspecified abnormal findings in urine: Secondary | ICD-10-CM | POA: Diagnosis not present

## 2020-08-23 DIAGNOSIS — E860 Dehydration: Secondary | ICD-10-CM

## 2020-08-23 DIAGNOSIS — Z0001 Encounter for general adult medical examination with abnormal findings: Secondary | ICD-10-CM | POA: Diagnosis not present

## 2020-08-23 DIAGNOSIS — N5201 Erectile dysfunction due to arterial insufficiency: Secondary | ICD-10-CM

## 2020-08-23 LAB — URINALYSIS, ROUTINE W REFLEX MICROSCOPIC
Bilirubin Urine: NEGATIVE
Ketones, ur: NEGATIVE
Nitrite: NEGATIVE
RBC / HPF: NONE SEEN (ref 0–?)
Specific Gravity, Urine: >=1.03 — AB (ref 1.000–1.030)
Total Protein, Urine: NEGATIVE
Urine Glucose: NEGATIVE
Urobilinogen, UA: 0.2 (ref 0.0–1.0)
pH: 6 (ref 5.0–8.0)

## 2020-08-23 LAB — CBC
HCT: 43.1 % (ref 39.0–52.0)
Hemoglobin: 14.3 g/dL (ref 13.0–17.0)
MCHC: 33.2 g/dL (ref 30.0–36.0)
MCV: 92.1 fl (ref 78.0–100.0)
Platelets: 239 10*3/uL (ref 150.0–400.0)
RBC: 4.68 Mil/uL (ref 4.22–5.81)
RDW: 14.1 % (ref 11.5–15.5)
WBC: 4.2 10*3/uL (ref 4.0–10.5)

## 2020-08-23 LAB — PSA: PSA: 0.24 ng/mL (ref 0.10–4.00)

## 2020-08-23 MED ORDER — SILDENAFIL CITRATE 20 MG PO TABS
ORAL_TABLET | ORAL | 1 refills | Status: DC
Start: 2020-08-23 — End: 2021-05-03

## 2020-08-23 MED ORDER — BUPROPION HCL ER (XL) 150 MG PO TB24: ORAL_TABLET | ORAL | 1 refills | Status: DC
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
## 2020-08-23 NOTE — Patient Instructions (Signed)
Health Maintenance, Male Adopting a healthy lifestyle and getting preventive care are important in promoting health and wellness. Ask your health care provider about:  The right schedule for you to have regular tests and exams.  Things you can do on your own to prevent diseases and keep yourself healthy. What should I know about diet, weight, and exercise? Eat a healthy diet  Eat a diet that includes plenty of vegetables, fruits, low-fat dairy products, and lean protein.  Do not eat a lot of foods that are high in solid fats, added sugars, or sodium.   Maintain a healthy weight Body mass index (BMI) is a measurement that can be used to identify possible weight problems. It estimates body fat based on height and weight. Your health care provider can help determine your BMI and help you achieve or maintain a healthy weight. Get regular exercise Get regular exercise. This is one of the most important things you can do for your health. Most adults should:  Exercise for at least 150 minutes each week. The exercise should increase your heart rate and make you sweat (moderate-intensity exercise).  Do strengthening exercises at least twice a week. This is in addition to the moderate-intensity exercise.  Spend less time sitting. Even light physical activity can be beneficial. Watch cholesterol and blood lipids Have your blood tested for lipids and cholesterol at 50 years of age, then have this test every 5 years. You may need to have your cholesterol levels checked more often if:  Your lipid or cholesterol levels are high.  You are older than 50 years of age.  You are at high risk for heart disease. What should I know about cancer screening? Many types of cancers can be detected early and may often be prevented. Depending on your health history and family history, you may need to have cancer screening at various ages. This may include screening for:  Colorectal cancer.  Prostate  cancer.  Skin cancer.  Lung cancer. What should I know about heart disease, diabetes, and high blood pressure? Blood pressure and heart disease  High blood pressure causes heart disease and increases the risk of stroke. This is more likely to develop in people who have high blood pressure readings, are of African descent, or are overweight.  Talk with your health care provider about your target blood pressure readings.  Have your blood pressure checked: ? Every 3-5 years if you are 18-39 years of age. ? Every year if you are 40 years old or older.  If you are between the ages of 65 and 75 and are a current or former smoker, ask your health care provider if you should have a one-time screening for abdominal aortic aneurysm (AAA). Diabetes Have regular diabetes screenings. This checks your fasting blood sugar level. Have the screening done:  Once every three years after age 45 if you are at a normal weight and have a low risk for diabetes.  More often and at a younger age if you are overweight or have a high risk for diabetes. What should I know about preventing infection? Hepatitis B If you have a higher risk for hepatitis B, you should be screened for this virus. Talk with your health care provider to find out if you are at risk for hepatitis B infection. Hepatitis C Blood testing is recommended for:  Everyone born from 1945 through 1965.  Anyone with known risk factors for hepatitis C. Sexually transmitted infections (STIs)  You should be screened each   year for STIs, including gonorrhea and chlamydia, if: ? You are sexually active and are younger than 50 years of age. ? You are older than 50 years of age and your health care provider tells you that you are at risk for this type of infection. ? Your sexual activity has changed since you were last screened, and you are at increased risk for chlamydia or gonorrhea. Ask your health care provider if you are at risk.  Ask your  health care provider about whether you are at high risk for HIV. Your health care provider may recommend a prescription medicine to help prevent HIV infection. If you choose to take medicine to prevent HIV, you should first get tested for HIV. You should then be tested every 3 months for as long as you are taking the medicine. Follow these instructions at home: Lifestyle  Do not use any products that contain nicotine or tobacco, such as cigarettes, e-cigarettes, and chewing tobacco. If you need help quitting, ask your health care provider.  Do not use street drugs.  Do not share needles.  Ask your health care provider for help if you need support or information about quitting drugs. Alcohol use  Do not drink alcohol if your health care provider tells you not to drink.  If you drink alcohol: ? Limit how much you have to 0-2 drinks a day. ? Be aware of how much alcohol is in your drink. In the [place] one drink equals one 12 oz bottle of beer (355 mL), one 5 oz glass of wine (148 mL), or one 1 oz glass of hard liquor (44 mL). General instructions  Schedule regular health, dental, and eye exams.  Stay current with your vaccines.  Tell your health care provider if: ? You often feel depressed. ? You have ever been abused or do not feel safe at home. Summary  Adopting a healthy lifestyle and getting preventive care are important in promoting health and wellness.  Follow your health care provider's instructions about healthy diet, exercising, and getting tested or screened for diseases.  Follow your health care provider's instructions on monitoring your cholesterol and blood pressure. This information is not intended to replace advice given to you by your health care provider. Make sure you discuss any questions you have with your health care provider. Document Revised: 03/04/2018 Document Reviewed: 03/04/2018 Elsevier Patient Education  2021 [place] Years  Old, Male Preventive care refers to lifestyle choices and visits with your health care provider that can promote health and wellness. This includes:  A yearly physical exam. This is also called an annual wellness visit.  Regular dental and eye exams.  Immunizations.  Screening for certain conditions.  Healthy lifestyle choices, such as: ? Eating a healthy diet. ? Getting regular exercise. ? Not using drugs or products that contain nicotine and tobacco. ? Limiting alcohol use. What can I expect for my preventive care visit? Physical exam Your health care provider will check your:  Height and weight. These may be used to calculate your BMI (body mass index). BMI is a measurement that tells if you are at a healthy weight.  Heart rate and blood pressure.  Body temperature.  Skin for abnormal spots. Counseling Your health care provider may ask you questions about your:  Past medical problems.  Family's medical history.  Alcohol, tobacco, and drug use.  Emotional well-being.  Home life and relationship well-being.  Sexual activity.  Diet, exercise, and sleep habits.  Work and work Statistician.  Access to firearms. What immunizations do I need? Vaccines are usually given at various ages, according to a schedule. Your health care provider will recommend vaccines for you based on your age, medical history, and lifestyle or other factors, such as travel or where you work.   What tests do I need? Blood tests  Lipid and cholesterol levels. These may be checked every 5 years, or more often if you are over 44 years old.  Hepatitis C test.  Hepatitis B test. Screening  Lung cancer screening. You may have this screening every year starting at age 77 if you have a 30-pack-year history of smoking and currently smoke or have quit within the past 15 years.  Prostate cancer screening. Recommendations will vary depending on your family history and other risks.  Genital exam  to check for testicular cancer or hernias.  Colorectal cancer screening. ? All adults should have this screening starting at age 36 and continuing until age 28. ? Your health care provider may recommend screening at age 16 if you are at increased risk. ? You will have tests every 1-10 years, depending on your results and the type of screening test.  Diabetes screening. ? This is done by checking your blood sugar (glucose) after you have not eaten for a while (fasting). ? You may have this done every 1-3 years.  STD (sexually transmitted disease) testing, if you are at risk. Follow these instructions at home: Eating and drinking  Eat a diet that includes fresh fruits and vegetables, whole grains, lean protein, and low-fat dairy products.  Take vitamin and mineral supplements as recommended by your health care provider.  Do not drink alcohol if your health care provider tells you not to drink.  If you drink alcohol: ? Limit how much you have to 0-2 drinks a day. ? Be aware of how much alcohol is in your drink. In the [place] one drink equals one 12 oz bottle of beer (355 mL), one 5 oz glass of wine (148 mL), or one 1 oz glass of hard liquor (44 mL).   Lifestyle  Take daily care of your teeth and gums. Brush your teeth every morning and night with fluoride toothpaste. Floss one time each day.  Stay active. Exercise for at least 30 minutes 5 or more days each week.  Do not use any products that contain nicotine or tobacco, such as cigarettes, e-cigarettes, and chewing tobacco. If you need help quitting, ask your health care provider.  Do not use drugs.  If you are sexually active, practice safe sex. Use a condom or other form of protection to prevent STIs (sexually transmitted infections).  If told by your health care provider, take low-dose aspirin daily starting at age 10.  Find healthy ways to cope with stress, such as: ? Meditation, yoga, or listening to  music. ? Journaling. ? Talking to a trusted person. ? Spending time with friends and family. Safety  Always wear your seat belt while driving or riding in a vehicle.  Do not drive: ? If you have been drinking alcohol. Do not ride with someone who has been drinking. ? When you are tired or distracted. ? While texting.  Wear a helmet and other protective equipment during sports activities.  If you have firearms in your house, make sure you follow all gun safety procedures. What's next?  Go to your health care provider once a year for an annual wellness visit.  Ask your health  care provider how often you should have your eyes and teeth checked.  Stay up to date on all vaccines. This information is not intended to replace advice given to you by your health care provider. Make sure you discuss any questions you have with your health care provider. Document Revised: 12/08/2018 Document Reviewed: 03/05/2018 Elsevier Patient Education  2021 [place]. Bupropion extended-release tablets (Depression/Mood Disorders) What is this medicine? BUPROPION (byoo PROE pee on) is used to treat depression. This medicine may be used for other purposes; ask your health care provider or pharmacist if you have questions. COMMON BRAND NAME(S): Aplenzin, Budeprion XL, Forfivo XL, Wellbutrin XL What should I tell my health care provider before I take this medicine? They need to know if you have any of these conditions:  an eating disorder, such as anorexia or bulimia  bipolar disorder or psychosis  diabetes or high blood sugar, treated with medication  glaucoma  head injury or brain tumor  heart disease, previous heart attack, or irregular heart beat  high blood pressure  kidney or liver disease  seizures (convulsions)  suicidal thoughts or a previous suicide attempt  Tourette's syndrome  weight loss  an unusual or allergic reaction to bupropion, other medicines, foods, dyes, or  preservatives  breast-feeding  pregnant or trying to become pregnant How should I use this medicine? Take this medicine by mouth with a glass of water. Follow the directions on the prescription label. You can take it with or without food. If it upsets your stomach, take it with food. Do not crush, chew, or cut these tablets. This medicine is taken once daily at the same time each day. Do not take your medicine more often than directed. Do not stop taking this medicine suddenly except upon the advice of your doctor. Stopping this medicine too quickly may cause serious side effects or your condition may worsen. A special MedGuide will be given to you by the pharmacist with each prescription and refill. Be sure to read this information carefully each time. Talk to your pediatrician regarding the use of this medicine in children. Special care may be needed. Overdosage: If you think you have taken too much of this medicine contact a poison control center or emergency room at once. NOTE: This medicine is only for you. Do not share this medicine with others. What if I miss a dose? If you miss a dose, skip the missed dose and take your next tablet at the regular time. Do not take double or extra doses. What may interact with this medicine? Do not take this medicine with any of the following medications:  linezolid  MAOIs like Azilect, Carbex, Eldepryl, Marplan, Nardil, and Parnate  methylene blue (injected into a vein)  other medicines that contain bupropion like Zyban This medicine may also interact with the following medications:  alcohol  certain medicines for anxiety or sleep  certain medicines for blood pressure like metoprolol, propranolol  certain medicines for depression or psychotic disturbances  certain medicines for HIV or AIDS like efavirenz, lopinavir, nelfinavir, ritonavir  certain medicines for irregular heart beat like propafenone, flecainide  certain medicines for  Parkinson's disease like amantadine, levodopa  certain medicines for seizures like carbamazepine, phenytoin, phenobarbital  cimetidine  clopidogrel  cyclophosphamide  digoxin  furazolidone  isoniazid  nicotine  orphenadrine  procarbazine  steroid medicines like prednisone or cortisone  stimulant medicines for attention disorders, weight loss, or to stay awake  tamoxifen  theophylline  thiotepa  ticlopidine  tramadol  warfarin This list may not describe all possible interactions. Give your health care provider a list of all the medicines, herbs, non-prescription drugs, or dietary supplements you use. Also tell them if you smoke, drink alcohol, or use illegal drugs. Some items may interact with your medicine. What should I watch for while using this medicine? Tell your doctor if your symptoms do not get better or if they get worse. Visit your doctor or healthcare provider for regular checks on your progress. Because it may take several weeks to see the full effects of this medicine, it is important to continue your treatment as prescribed by your doctor. This medicine may cause serious skin reactions. They can happen weeks to months after starting the medicine. Contact your healthcare provider right away if you notice fevers or flu-like symptoms with a rash. The rash may be red or purple and then turn into blisters or peeling of the skin. Or, you might notice a red rash with swelling of the face, lips or lymph nodes in your neck or under your arms. Patients and their families should watch out for new or worsening thoughts of suicide or depression. Also watch out for sudden changes in feelings such as feeling anxious, agitated, panicky, irritable, hostile, aggressive, impulsive, severely restless, overly excited and hyperactive, or not being able to sleep. If this happens, especially at the beginning of treatment or after a change in dose, call your healthcare provider. Avoid  alcoholic drinks while taking this medicine. Drinking large amounts of alcoholic beverages, using sleeping or anxiety medicines, or quickly stopping the use of these agents while taking this medicine may increase your risk for a seizure. Do not drive or use heavy machinery until you know how this medicine affects you. This medicine can impair your ability to perform these tasks. Do not take this medicine close to bedtime. It may prevent you from sleeping. Your mouth may get dry. Chewing sugarless gum or sucking hard candy, and drinking plenty of water may help. Contact your doctor if the problem does not go away or is severe. The tablet shell for some brands of this medicine does not dissolve. This is normal. The tablet shell may appear whole in the stool. This is not a cause for concern. What side effects may I notice from receiving this medicine? Side effects that you should report to your doctor or health care professional as soon as possible:  allergic reactions like skin rash, itching or hives, swelling of the face, lips, or tongue  breathing problems  changes in vision  confusion  elevated mood, decreased need for sleep, racing thoughts, impulsive behavior  fast or irregular heartbeat  hallucinations, loss of contact with reality  increased blood pressure  rash, fever, and swollen lymph nodes  redness, blistering, peeling or loosening of the skin, including inside the mouth  seizures  suicidal thoughts or other mood changes  unusually weak or tired  vomiting Side effects that usually do not require medical attention (report to your doctor or health care professional if they continue or are bothersome):  constipation  headache  loss of appetite  nausea  tremors  weight loss This list may not describe all possible side effects. Call your doctor for medical advice about side effects. You may report side effects to FDA at 1-800-FDA-1088. Where should I keep my  medicine? Keep out of the reach of children. Store at room temperature between 15 and 30 degrees C (59 and 86 degrees F). Throw away any unused  medicine after the expiration date. NOTE: This sheet is a summary. It may not cover all possible information. If you have questions about this medicine, talk to your doctor, pharmacist, or health care provider.  2021 Elsevier/Gold Standard (2018-06-04 13:45:31)  http://APA.org/depression-guideline"> https://clinicalkey.com"> http://point-of-care.elsevierperformancemanager.com/skills/"> http://point-of-care.elsevierperformancemanager.com">  Managing Depression, Adult Depression is a mental health condition that affects your thoughts, feelings, and actions. Being diagnosed with depression can bring you relief if you did not know why you have felt or behaved a certain way. It could also leave you feeling overwhelmed with uncertainty about your future. Preparing yourself to manage your symptoms can help you feel more positive about your future. How to manage lifestyle changes Managing stress Stress is your body's reaction to life changes and events, both good and bad. Stress can add to your feelings of depression. Learning to manage your stress can help lessen your feelings of depression. Try some of the following approaches to reducing your stress (stress reduction techniques):  Listen to music that you enjoy and that inspires you.  Try using a meditation app or take a meditation class.  Develop a practice that helps you connect with your spiritual self. Walk in nature, pray, or go to a place of worship.  Do some deep breathing. To do this, inhale slowly through your nose. Pause at the top of your inhale for a few seconds and then exhale slowly, letting your muscles relax.  Practice yoga to help relax and work your muscles. Choose a stress reduction technique that suits your lifestyle and personality. These techniques take time and practice to develop.  Set aside 5-15 minutes a day to do them. Therapists can offer training in these techniques. Other things you can do to manage stress include:  Keeping a stress diary.  Knowing your limits and saying no when you think something is too much.  Paying attention to how you react to certain situations. You may not be able to control everything, but you can change your reaction.  Adding humor to your life by watching funny films or TV shows.  Making time for activities that you enjoy and that relax you.   Medicines Medicines, such as antidepressants, are often a part of treatment for depression.  Talk with your pharmacist or health care provider about all the medicines, supplements, and herbal products that you take, their possible side effects, and what medicines and other products are safe to take together.  Make sure to report any side effects you may have to your health care provider. Relationships Your health care provider may suggest family therapy, couples therapy, or individual therapy as part of your treatment. How to recognize changes Everyone responds differently to treatment for depression. As you recover from depression, you may start to:  Have more interest in doing activities.  Feel less hopeless.  Have more energy.  Overeat less often, or have a better appetite.  Have better mental focus. It is important to recognize if your depression is not getting better or is getting worse. The symptoms you had in the beginning may return, such as:  Tiredness (fatigue) or low energy.  Eating too much or too little.  Sleeping too much or too little.  Feeling restless, agitated, or hopeless.  Trouble focusing or making decisions.  Unexplained physical complaints.  Feeling irritable, angry, or aggressive. If you or your family members notice these symptoms coming back, let your health care provider know right away. Follow these instructions at home: Activity  Try to get  some form  of exercise each day, such as walking, biking, swimming, or lifting weights.  Practice stress reduction techniques.  Engage your mind by taking a class or doing some volunteer work.   Lifestyle  Get the right amount and quality of sleep.  Cut down on using caffeine, tobacco, alcohol, and other potentially harmful substances.  Eat a healthy diet that includes plenty of vegetables, fruits, whole grains, low-fat dairy products, and lean protein. Do not eat a lot of foods that are high in solid fats, added sugars, or salt (sodium). General instructions  Take over-the-counter and prescription medicines only as told by your health care provider.  Keep all follow-up visits as told by your health care provider. This is important. Where to find support Talking to others Friends and family members can be sources of support and guidance. Talk to trusted friends or family members about your condition. Explain your symptoms to them, and let them know that you are working with a health care provider to treat your depression. Tell friends and family members how they also can be helpful.   Finances  Find appropriate mental health providers that fit with your financial situation.  Talk with your health care provider about options to get reduced prices on your medicines. Where to find more information You can find support in your area from:  Anxiety and Depression Association of America (ADAA): www.adaa.org  Mental Health America: www.mentalhealthamerica.net  [employer] on Mental Illness: www.nami.org Contact a health care provider if:  You stop taking your antidepressant medicines, and you have any of these symptoms: ? Nausea. ? Headache. ? Light-headedness. ? Chills and body aches. ? Not being able to sleep (insomnia).  You or your friends and family think your depression is getting worse. Get help right away if:  You have thoughts of hurting yourself or others. If you  ever feel like you may hurt yourself or others, or have thoughts about taking your own life, get help right away. Go to your nearest emergency department or:  Call your local emergency services (911 in the [place]  Call a suicide crisis helpline, such as the [place] at 517 560 8134. This is open 24 hours a day in the [place]  Text the Crisis Text Line at 3041398829 (in the [place].). Summary  If you are diagnosed with depression, preparing yourself to manage your symptoms is a good way to feel positive about your future.  Work with your health care provider on a management plan that includes stress reduction techniques, medicines (if applicable), therapy, and healthy lifestyle habits.  Keep talking with your health care provider about how your treatment is working.  If you have thoughts about taking your own life, call a suicide crisis helpline or text a crisis text line. This information is not intended to replace advice given to you by your health care provider. Make sure you discuss any questions you have with your health care provider. Document Revised: 01/20/2019 Document Reviewed: 01/20/2019 Elsevier Patient Education  2021 [employer].

## 2020-08-24 LAB — COMPREHENSIVE METABOLIC PANEL
ALT: 18 U/L (ref 0–53)
AST: 18 U/L (ref 0–37)
Albumin: 4.1 g/dL (ref 3.5–5.2)
Alkaline Phosphatase: 57 U/L (ref 39–117)
BUN: 10 mg/dL (ref 6–23)
CO2: 24 mEq/L (ref 19–32)
Calcium: 9.9 mg/dL (ref 8.4–10.5)
Chloride: 108 mEq/L (ref 96–112)
Creatinine, Ser: 0.93 mg/dL (ref 0.40–1.50)
GFR: 96.45 mL/min (ref >60.00)
Glucose, Bld: 89 mg/dL (ref 70–99)
Potassium: 4.4 mEq/L (ref 3.5–5.1)
Sodium: 143 mEq/L (ref 135–145)
Total Bilirubin: 0.4 mg/dL (ref 0.2–1.2)
Total Protein: 6.6 g/dL (ref 6.0–8.3)

## 2020-08-24 LAB — LIPID PANEL
Cholesterol: 179 mg/dL (ref 0–200)
HDL: 36.7 mg/dL — ABNORMAL LOW (ref >39.00)
LDL Cholesterol: 126 mg/dL — ABNORMAL HIGH (ref 0–99)
NonHDL: 142.54
Total CHOL/HDL Ratio: 5
Triglycerides: 84 mg/dL (ref 0.0–149.0)
VLDL: 16.8 mg/dL (ref 0.0–40.0)

## 2020-08-28 DIAGNOSIS — R8290 Unspecified abnormal findings in urine: Secondary | ICD-10-CM | POA: Insufficient documentation

## 2020-08-28 NOTE — Addendum Note (Signed)
Addended by: Jon Billings on: 08/28/2020 07:59 AM   Modules accepted: Orders

## 2020-09-07 ENCOUNTER — Ambulatory Visit (INDEPENDENT_AMBULATORY_CARE_PROVIDER_SITE_OTHER): Payer: 59 | Admitting: Psychology

## 2020-09-07 DIAGNOSIS — F330 Major depressive disorder, recurrent, mild: Secondary | ICD-10-CM | POA: Diagnosis not present

## 2020-09-13 ENCOUNTER — Ambulatory Visit (INDEPENDENT_AMBULATORY_CARE_PROVIDER_SITE_OTHER): Payer: 59 | Admitting: Psychology

## 2020-09-13 DIAGNOSIS — F330 Major depressive disorder, recurrent, mild: Secondary | ICD-10-CM

## 2020-09-14 ENCOUNTER — Telehealth: Payer: Self-pay

## 2020-09-14 NOTE — Telephone Encounter (Signed)
Pt calling to request a higher dosage of medication Sildenafil, pt said that he explained to provider that the lower dose does not help him.  Pt is currently on the Sidenafil 20mg .  Please advise.  [phone]

## 2020-09-14 NOTE — Telephone Encounter (Signed)
Spoke with patient who verbally understood instructions for sildenafil 20 have changed to taking 3-5 tablets 45 minutes prior to intercourse. Patient states that he had not picked up Rx because it was still 20mg  but he will pick them up and try the take more than 1 at a time.

## 2020-09-19 ENCOUNTER — Ambulatory Visit: Payer: 59 | Admitting: Psychology

## 2020-10-23 ENCOUNTER — Other Ambulatory Visit: Payer: Self-pay | Admitting: Family Medicine

## 2020-10-23 DIAGNOSIS — F339 Major depressive disorder, recurrent, unspecified: Secondary | ICD-10-CM

## 2020-10-26 ENCOUNTER — Telehealth: Payer: Self-pay | Admitting: Family Medicine

## 2020-10-26 NOTE — Telephone Encounter (Signed)
Pt said he talked to [facility] health about sildenafil (REVATIO) 20 MG tablet being covered and that they said a prior auth would have to be done and he wanted to know if we could do that. He would like a call back about it as well

## 2020-11-03 ENCOUNTER — Telehealth: Payer: Self-pay | Admitting: Family Medicine

## 2020-11-03 NOTE — Telephone Encounter (Signed)
Pt called and said that him and his girlfriend just broke up and she wont give him his buPROPion (WELLBUTRIN XL) 150 MG 24 hr tablet from her place because she wont open the door for him and he said that she wouldn't open the door for the cops as well. He said he really needs his medication and because he just got it filled its to early to fill it again, He wanted to know what could be done. I told him to call the pharmacy as well to see if there was anything they could do and he said he would. Please advise

## 2020-11-14 ENCOUNTER — Other Ambulatory Visit: Payer: Self-pay | Admitting: Nurse Practitioner

## 2020-11-14 DIAGNOSIS — M5416 Radiculopathy, lumbar region: Secondary | ICD-10-CM

## 2020-11-21 NOTE — Telephone Encounter (Signed)
Tried reaching patient several times, phone not in service. Unable to reach patient.

## 2020-11-27 ENCOUNTER — Other Ambulatory Visit: Payer: Self-pay | Admitting: Family Medicine

## 2020-11-27 DIAGNOSIS — F339 Major depressive disorder, recurrent, unspecified: Secondary | ICD-10-CM

## 2020-11-28 ENCOUNTER — Inpatient Hospital Stay: Admission: RE | Admit: 2020-11-28 | Payer: 59 | Source: Ambulatory Visit

## 2020-11-28 NOTE — Telephone Encounter (Signed)
Tried calling patient to schedule appointment, phone not in service. Will call back

## 2020-12-18 ENCOUNTER — Telehealth: Payer: Self-pay | Admitting: Family Medicine

## 2020-12-18 NOTE — Telephone Encounter (Signed)
Pt requesting handicap info to be renewed.. not sure if they need an OV. Please advise

## 2020-12-20 NOTE — Telephone Encounter (Signed)
Returned patient call to see what handicap information was it that patient was needing renewed. No answer unable to leave message will call back.

## 2020-12-25 NOTE — Telephone Encounter (Signed)
Called patient for clarification on form needed. No answer unable to LM will call back.

## 2021-01-04 NOTE — Telephone Encounter (Signed)
He is needing a handicap placard renewal.

## 2021-01-10 NOTE — Telephone Encounter (Signed)
Pt wanted to follow up on this, he hasnt heard anything and wanted to know when he can come pick it up. Please advise. [phone]

## 2021-01-11 NOTE — Telephone Encounter (Signed)
Patient will need to fill out patients portion on form waiting to be filled out by PCP. Patient aware someone will call when ready or call to schedule appointment if needed.

## 2021-01-12 NOTE — Telephone Encounter (Signed)
Spoke to pt and informed him forms were ready to be picked up.

## 2021-01-12 NOTE — Telephone Encounter (Signed)
Called patient to inform that requested form was ready for pick up. No answer unable to leave a message will call back.

## 2021-02-05 ENCOUNTER — Other Ambulatory Visit: Payer: Self-pay | Admitting: Family Medicine

## 2021-02-05 DIAGNOSIS — F339 Major depressive disorder, recurrent, unspecified: Secondary | ICD-10-CM

## 2021-04-20 IMAGING — DX DG LUMBAR SPINE COMPLETE 4+V
5 series · 5 of 5 positions shown · non-contrast
Comparison: January 14, 2017

CLINICAL DATA: Low back pain.

EXAM:
LUMBAR SPINE - COMPLETE 4+ VIEW

[lumbar spine ap]
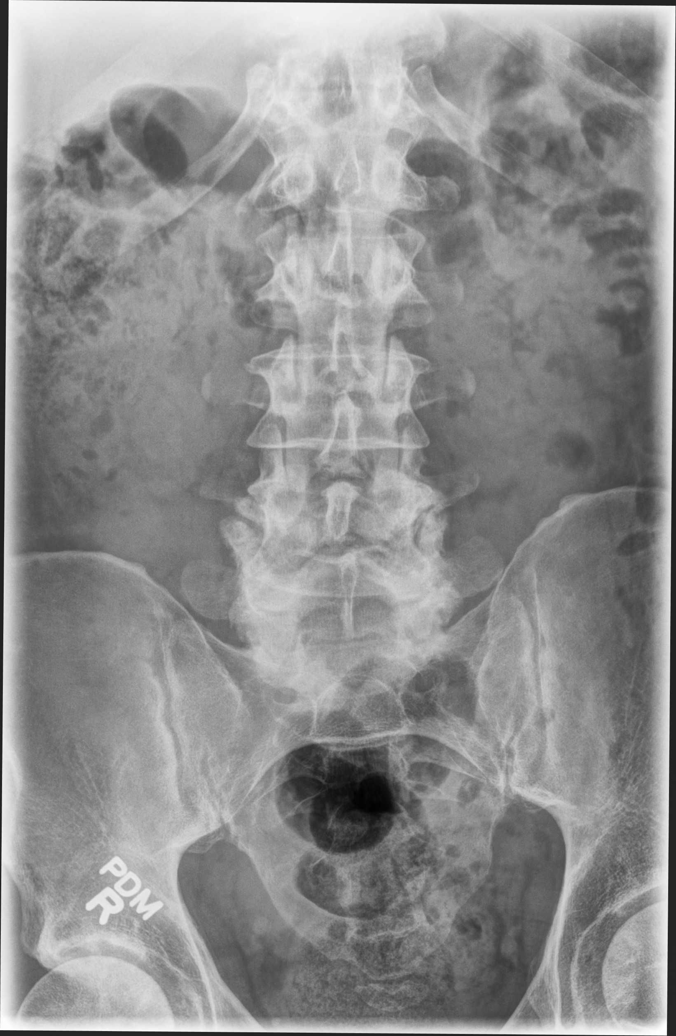

[lumbar spine mlo (1 of 2)]
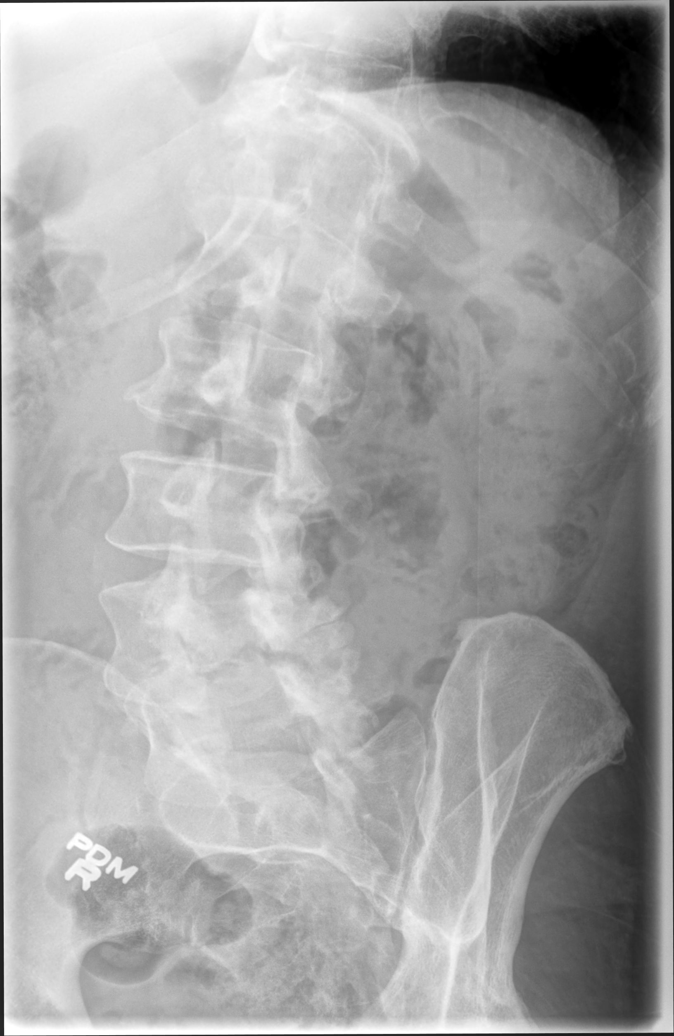

[lumbar spine mlo (2 of 2)]
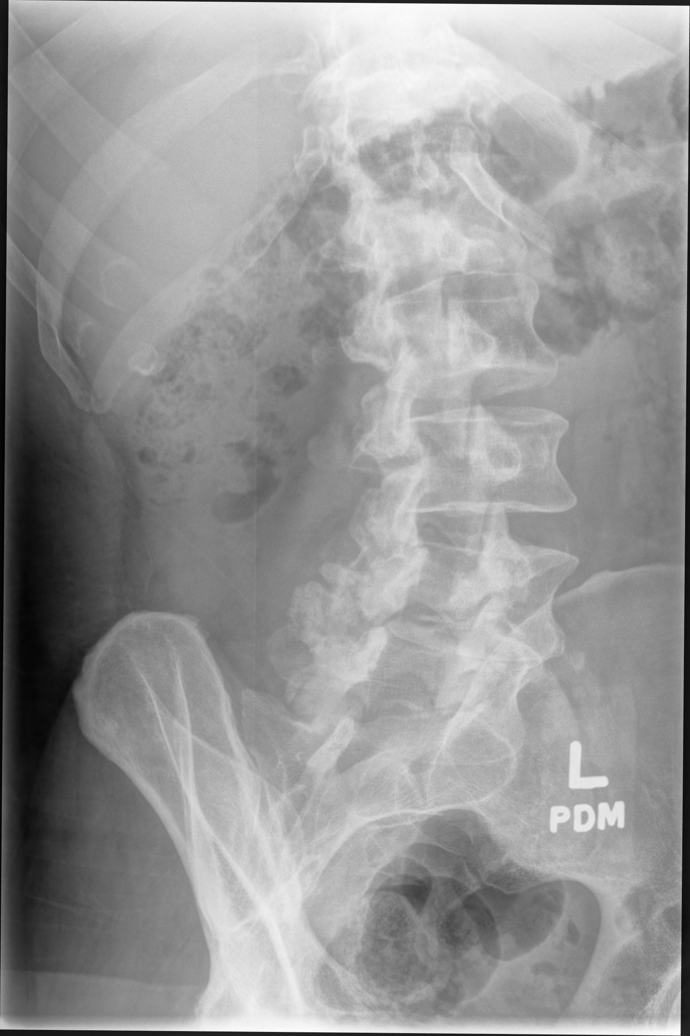

[lumbar spine lat (1 of 2)]
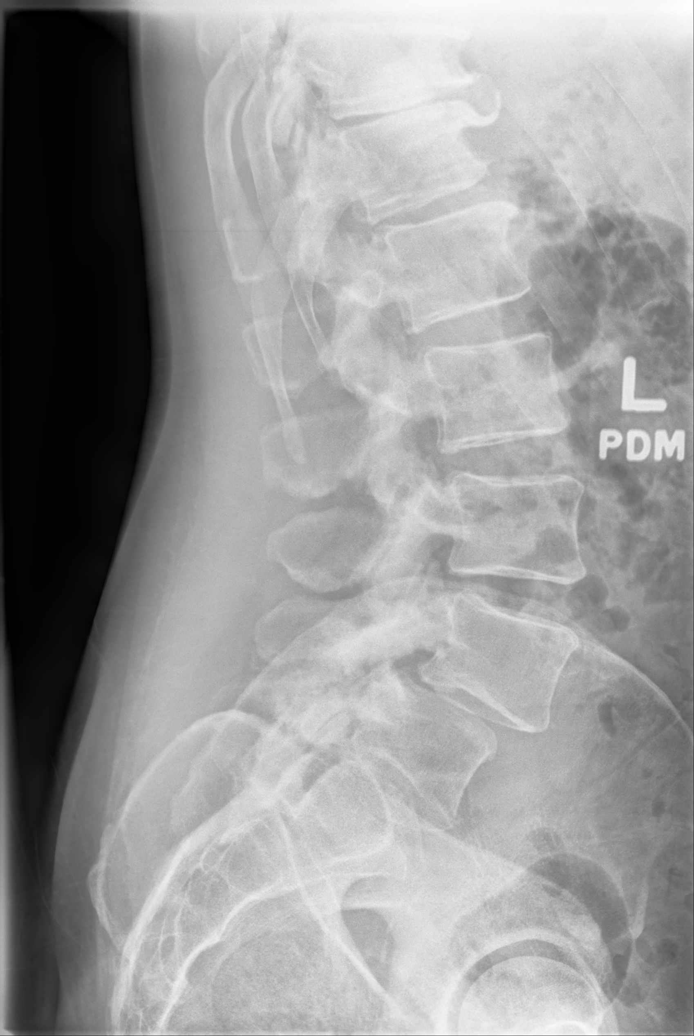

[lumbar spine lat (2 of 2)]
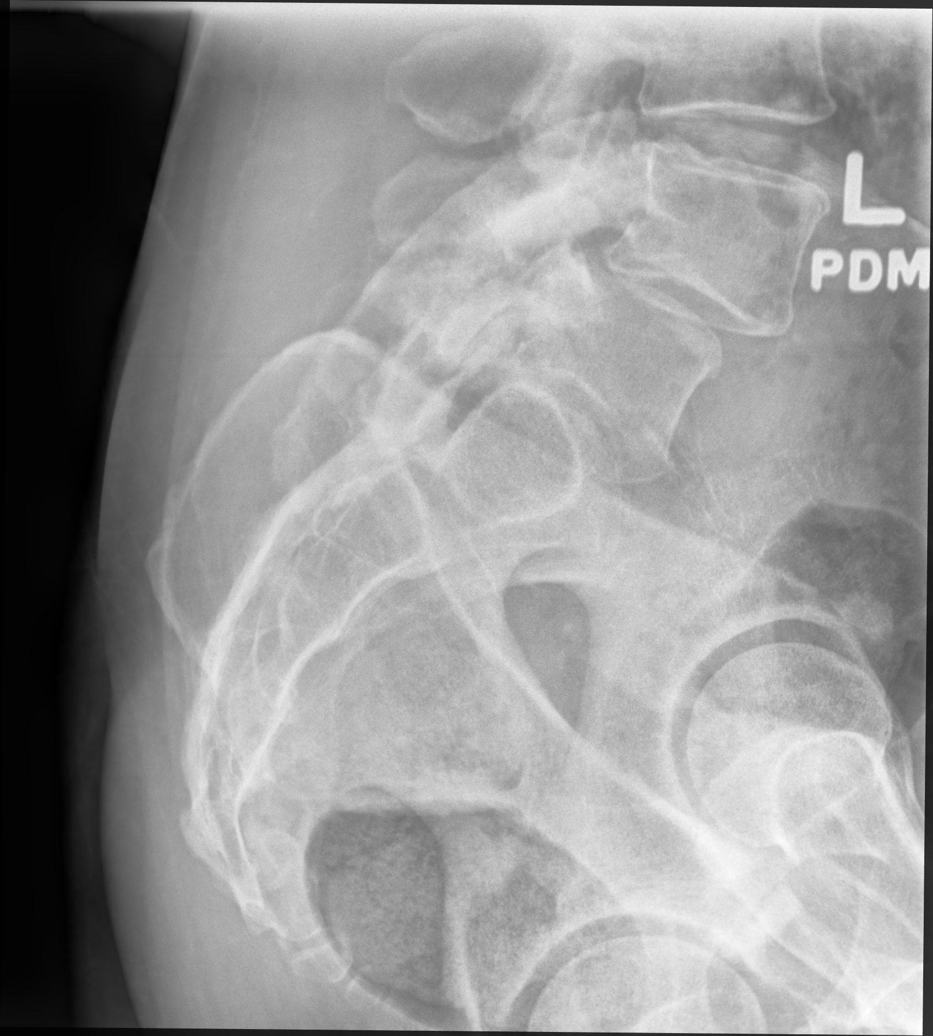

[5 of 5 positions shown; findings below may reference images not displayed]

FINDINGS: There is no evidence of lumbar spine fracture. Approximately 7 mm
anterolisthesis of the L4 on L5 vertebral body is seen. This is
present on the prior exam. Mild to moderate severity intervertebral
disc space narrowing is seen at the level of L4-L5.
IMPRESSION: 1. Stable, approximately 7 mm anterolisthesis of L4 on L5 vertebral
body.
2. Mild to moderate severity degenerative changes at the level of
L4-L5.

## 2021-05-02 ENCOUNTER — Other Ambulatory Visit: Payer: Self-pay

## 2021-05-03 ENCOUNTER — Ambulatory Visit (INDEPENDENT_AMBULATORY_CARE_PROVIDER_SITE_OTHER): Payer: Self-pay | Admitting: Family Medicine

## 2021-05-03 ENCOUNTER — Encounter: Payer: Self-pay | Admitting: Family Medicine

## 2021-05-03 VITALS — BP 122/84 | HR 75 | Temp 98.2°F | Ht 72.0 in | Wt 204.0 lb

## 2021-05-03 DIAGNOSIS — R5383 Other fatigue: Secondary | ICD-10-CM

## 2021-05-03 DIAGNOSIS — N5201 Erectile dysfunction due to arterial insufficiency: Secondary | ICD-10-CM

## 2021-05-03 DIAGNOSIS — R2681 Unsteadiness on feet: Secondary | ICD-10-CM

## 2021-05-03 DIAGNOSIS — R350 Frequency of micturition: Secondary | ICD-10-CM

## 2021-05-03 DIAGNOSIS — F339 Major depressive disorder, recurrent, unspecified: Secondary | ICD-10-CM

## 2021-05-03 DIAGNOSIS — N401 Enlarged prostate with lower urinary tract symptoms: Secondary | ICD-10-CM

## 2021-05-03 MED ORDER — BUPROPION HCL ER (XL) 150 MG PO TB24: 300.0000 mg | ORAL_TABLET | Freq: Every day | ORAL | 1 refills | Status: DC

## 2021-05-03 MED ORDER — SILDENAFIL CITRATE 20 MG PO TABS: ORAL_TABLET | ORAL | 1 refills | Status: DC

## 2021-05-03 MED ORDER — TAMSULOSIN HCL 0.4 MG PO CAPS: 0.4000 mg | ORAL_CAPSULE | Freq: Every day | ORAL | 1 refills | Status: DC

## 2021-05-03 NOTE — Progress Notes (Signed)
Established Patient Office Visit  Subjective:  Patient ID: Thomas Rocha, male    DOB: 1971-01-17  Age: 51 y.o. MRN: [id number]  CC:  Chief Complaint  Patient presents with   Annual Exam    CPE, concerns about urine frequency and urine stream.     HPI Thomas Rocha presents for follow-up of depression, fatigue and ED. has been experiencing urinary frequency, urgency, decreased force of stream and post void dribble.  Nocturia 3-5 times nightly.  Has a cup of coffee every other day.  Increased alcohol usage 1 night on the weekend.  Recent physical exam showed enlarged prostate with a normal PSA.  Denies dysuria or discharge.  History of depression associated with his disability.  He has bilateral foot drop and has issues with his gait.  He is unstable and uses a cane.  Has applied for disability.  Has been out of his Wellbutrin for about a month.  It helped.  Outlook and libido improved while he was taking it.  He was taking 1 twice daily and had some difficulty sleeping.  Has been fatigued.   Informed  History reviewed. No pertinent past medical history.  Past Surgical History:  Procedure Laterality Date   CERVICAL LAMINECTOMY     HERNIA REPAIR     LUMBAR FUSION Bilateral 09/02/2019   L4-L5   LUMBAR LAMINECTOMY Bilateral 09/02/2019    Family History  Problem Relation Age of Onset   Cancer Mother    Diabetes Maternal Grandmother    Hypertension Maternal Grandmother     Social History   Socioeconomic History   Marital status: Single    Spouse name: Not on file   Number of children: Not on file   Years of education: Not on file   Highest education level: Not on file  Occupational History   Not on file  Tobacco Use   Smoking status: Every Day    Packs/day: 1.00    Types: Cigarettes   Smokeless tobacco: Never  Vaping Use   Vaping Use: Never used  Substance and Sexual Activity   Alcohol use: Yes    Alcohol/week: 5.0 standard drinks    Types: 5 Cans of beer per week     Comment: 5 beers three timies a week    Drug use: Yes    Types: Cocaine, Marijuana    Comment: last used cocaine 5 days ago    Sexual activity: Yes  Other Topics Concern   Not on file  Social History Narrative   Not on file   Social Determinants of Health   Financial Resource Strain: Not on file  Food Insecurity: Not on file  Transportation Needs: Not on file  Physical Activity: Not on file  Stress: Not on file  Social Connections: Not on file  Intimate Partner Violence: Not on file    Outpatient Medications Prior to Visit  Medication Sig Dispense Refill   acetaminophen (TYLENOL) 500 MG tablet Take 500 mg by mouth every 6 (six) hours as needed for mild pain.     AMBULATORY NON FORMULARY MEDICATION Ankle Foot Orthosis for right leg foot drop.  Disp 1. M21.371 1 each 0   Misc. Devices MISC AFO for left foot     buPROPion (WELLBUTRIN XL) 150 MG 24 hr tablet TAKE 2 TABLETS BY MOUTH DAILY 60 tablet 1   sildenafil (REVATIO) 20 MG tablet May take 3-5 tablets 45 minutes prior to intercourse. No more than 5 tablets daily. 25 tablet 1   tiZANidine (ZANAFLEX)  4 MG tablet Take 4 mg by mouth 3 (three) times daily. (Patient not taking: Reported on 05/03/2021)     DULoxetine (CYMBALTA) 30 MG capsule Take by mouth. (Patient not taking: Reported on 08/23/2020)     gabapentin (NEURONTIN) 300 MG capsule One tab PO qHS for a week, then BID for a week, then TID. May double weekly to a max of 3,600mg /day (Patient not taking: Reported on 08/23/2020) 180 capsule 3   oxyCODONE-acetaminophen (PERCOCET) 5-325 MG tablet Take 2 tablets by mouth every 4 (four) hours as needed. (Patient not taking: Reported on 08/23/2020) 20 tablet 0   No facility-administered medications prior to visit.    No Known Allergies  ROS Review of Systems  Constitutional:  Negative for chills, diaphoresis, fever and unexpected weight change.  HENT: Negative.    Eyes:  Negative for photophobia and visual disturbance.  Respiratory:  Negative.    Cardiovascular: Negative.   Gastrointestinal: Negative.   Endocrine: Negative for polyphagia and polyuria.  Genitourinary:  Positive for difficulty urinating, frequency and urgency. Negative for decreased urine volume.  Musculoskeletal:  Positive for gait problem.  Skin: Negative.   Neurological:  Positive for weakness. Negative for speech difficulty and light-headedness.     Objective:    Physical Exam Vitals and nursing note reviewed.  Constitutional:      General: He is not in acute distress.    Appearance: Normal appearance. He is not ill-appearing, toxic-appearing or diaphoretic.  HENT:     Head: Normocephalic and atraumatic.     Right Ear: External ear normal.     Left Ear: External ear normal.     Mouth/Throat:     Mouth: Mucous membranes are moist.     Pharynx: Oropharynx is clear. No oropharyngeal exudate or posterior oropharyngeal erythema.  Eyes:     General: No scleral icterus.       Right eye: No discharge.        Left eye: No discharge.     Extraocular Movements: Extraocular movements intact.     Conjunctiva/sclera: Conjunctivae normal.     Pupils: Pupils are equal, round, and reactive to light.  Cardiovascular:     Rate and Rhythm: Normal rate and regular rhythm.  Pulmonary:     Effort: Pulmonary effort is normal.     Breath sounds: Normal breath sounds.  Abdominal:     General: Bowel sounds are normal.  Musculoskeletal:     Cervical back: No rigidity or tenderness.  Lymphadenopathy:     Cervical: No cervical adenopathy.  Skin:    General: Skin is warm and dry.  Neurological:     Mental Status: He is alert and oriented to person, place, and time.  Psychiatric:        Mood and Affect: Mood normal.        Behavior: Behavior normal.    BP 122/84 (BP Location: Right Arm, Patient Position: Sitting, Cuff Size: Normal)    Pulse 75    Temp 98.2 F (36.8 C) (Oral)    Ht 6' (1.829 m)    Wt 204 lb (92.5 kg)    SpO2 97%    BMI 27.67 kg/m  Wt  Readings from Last 3 Encounters:  05/03/21 204 lb (92.5 kg)  08/23/20 201 lb 12.8 oz (91.5 kg)  05/05/20 213 lb 12.8 oz (97 kg)     Health Maintenance Due  Topic Date Due   Hepatitis C Screening  Never done   COLONOSCOPY (Pts 45-24yrs Insurance coverage will need to  be confirmed)  Never done   Zoster Vaccines- Shingrix (1 of 2) Never done    There are no preventive care reminders to display for this patient.  Lab Results  Component Value Date   TSH 1.01 04/16/2019   Lab Results  Component Value Date   WBC 4.2 08/23/2020   HGB 14.3 08/23/2020   HCT 43.1 08/23/2020   MCV 92.1 08/23/2020   PLT 239.0 08/23/2020   Lab Results  Component Value Date   NA 143 08/23/2020   K 4.4 08/23/2020   CO2 24 08/23/2020   GLUCOSE 89 08/23/2020   BUN 10 08/23/2020   CREATININE 0.93 08/23/2020   BILITOT 0.4 08/23/2020   ALKPHOS 57 08/23/2020   AST 18 08/23/2020   ALT 18 08/23/2020   PROT 6.6 08/23/2020   ALBUMIN 4.1 08/23/2020   CALCIUM 9.9 08/23/2020   GFR 96.45 08/23/2020   Lab Results  Component Value Date   CHOL 179 08/23/2020   Lab Results  Component Value Date   HDL 36.70 (L) 08/23/2020   Lab Results  Component Value Date   LDLCALC 126 (H) 08/23/2020   Lab Results  Component Value Date   TRIG 84.0 08/23/2020   Lab Results  Component Value Date   CHOLHDL 5 08/23/2020   No results found for: HGBA1C    Assessment & Plan:   Problem List Items Addressed This Visit       Cardiovascular and Mediastinum   Erectile dysfunction due to arterial insufficiency   Relevant Medications   sildenafil (REVATIO) 20 MG tablet     Other   Gait instability   Depression, recurrent (HCC) - Primary   Relevant Medications   buPROPion (WELLBUTRIN XL) 150 MG 24 hr tablet   Other fatigue   Relevant Orders   TSH   Benign prostatic hyperplasia with urinary frequency   Relevant Medications   tamsulosin (FLOMAX) 0.4 MG CAPS capsule   Other Relevant Orders   Urinalysis, Routine  w reflex microscopic   Urine Culture    Meds ordered this encounter  Medications   tamsulosin (FLOMAX) 0.4 MG CAPS capsule    Sig: Take 1 capsule (0.4 mg total) by mouth daily.    Dispense:  90 capsule    Refill:  1   buPROPion (WELLBUTRIN XL) 150 MG 24 hr tablet    Sig: Take 2 tablets (300 mg total) by mouth daily.    Dispense:  180 tablet    Refill:  1   sildenafil (REVATIO) 20 MG tablet    Sig: May take 3-5 tablets 45 minutes prior to intercourse. No more than 5 tablets daily.    Dispense:  50 tablet    Refill:  1   The 10-year ASCVD risk score (Arnett DK, et al., 2019) is: 8.8%   Values used to calculate the score:     Age: 59 years     Sex: Male     Is Non-Hispanic African American: Yes     Diabetic: No     Tobacco smoker: Yes     Systolic Blood Pressure: 680 mmHg     Is BP treated: No     HDL Cholesterol: 36.7 mg/dL     Total Cholesterol: 179 mg/dL  Follow-up: Return in about 3 months (around 07/31/2021), or if symptoms worsen or fail to improve.    Libby Maw, MD

## 2021-05-04 LAB — URINALYSIS, ROUTINE W REFLEX MICROSCOPIC
Bilirubin Urine: NEGATIVE
Ketones, ur: NEGATIVE
Leukocytes,Ua: NEGATIVE
Nitrite: NEGATIVE
Specific Gravity, Urine: >=1.03 — AB (ref 1.000–1.030)
Total Protein, Urine: NEGATIVE
Urine Glucose: NEGATIVE
Urobilinogen, UA: 0.2 (ref 0.0–1.0)
pH: 5 (ref 5.0–8.0)

## 2021-05-04 LAB — URINE CULTURE
MICRO NUMBER:: 12987269
Result:: NO GROWTH
SPECIMEN QUALITY:: ADEQUATE

## 2021-05-04 LAB — TSH: TSH: 0.93 u[IU]/mL (ref 0.35–5.50)

## 2021-05-07 ENCOUNTER — Telehealth: Payer: Self-pay

## 2021-05-07 ENCOUNTER — Other Ambulatory Visit: Payer: Self-pay | Admitting: Family Medicine

## 2021-05-07 DIAGNOSIS — F339 Major depressive disorder, recurrent, unspecified: Secondary | ICD-10-CM

## 2021-05-07 MED ORDER — BUPROPION HCL ER (XL) 300 MG PO TB24: 300.0000 mg | ORAL_TABLET | Freq: Every day | ORAL | 1 refills | Status: DC

## 2021-05-07 NOTE — Telephone Encounter (Signed)
Pt states that per his pharmacy his insurance will not cover 2 tablets of Wellbutrin daily. His pharmacy has suggested changing the dose to one 300 mg tablet daily, instead of one 150 mg  tablets twice daily

## 2021-05-07 NOTE — Addendum Note (Signed)
Addended by: Jon Billings on: 05/07/2021 05:32 PM   Modules accepted: Orders

## 2021-05-07 NOTE — Telephone Encounter (Signed)
LOV  05/03/21  Please review and advise.  Thanks. Dm/cma

## 2021-05-08 NOTE — Telephone Encounter (Signed)
FYI: Pt returning call, said someone contact him this morning on 05/08/2021.  Informed pt of previous note. Pt picked up his prescription for 300 mg tablet. Pt is good.

## 2021-07-02 ENCOUNTER — Ambulatory Visit (INDEPENDENT_AMBULATORY_CARE_PROVIDER_SITE_OTHER): Payer: 59 | Admitting: Family Medicine

## 2021-07-02 ENCOUNTER — Encounter: Payer: Self-pay | Admitting: Family Medicine

## 2021-07-02 VITALS — BP 120/80 | HR 60 | Temp 98.0°F | Ht 72.0 in | Wt 195.6 lb

## 2021-07-02 DIAGNOSIS — M21371 Foot drop, right foot: Secondary | ICD-10-CM

## 2021-07-02 DIAGNOSIS — Z1211 Encounter for screening for malignant neoplasm of colon: Secondary | ICD-10-CM

## 2021-07-02 DIAGNOSIS — B351 Tinea unguium: Secondary | ICD-10-CM

## 2021-07-02 DIAGNOSIS — Z23 Encounter for immunization: Secondary | ICD-10-CM

## 2021-07-02 DIAGNOSIS — F339 Major depressive disorder, recurrent, unspecified: Secondary | ICD-10-CM

## 2021-07-02 DIAGNOSIS — L720 Epidermal cyst: Secondary | ICD-10-CM

## 2021-07-02 MED ORDER — DOXYCYCLINE HYCLATE 100 MG PO TABS: 100.0000 mg | ORAL_TABLET | Freq: Two times a day (BID) | ORAL | 0 refills | Status: AC

## 2021-07-02 MED ORDER — BUPROPION HCL ER (XL) 300 MG PO TB24: 300.0000 mg | ORAL_TABLET | Freq: Every day | ORAL | 1 refills | Status: DC

## 2021-07-02 NOTE — Progress Notes (Signed)
? ?Established Patient Office Visit ? ?Subjective:  ?Patient ID: Thomas Rocha, male    DOB: March 23, 1971  Age: 51 y.o. MRN: [id number] ? ?CC:  ?Chief Complaint  ?Patient presents with  ? Cyst  ?  Pt c/o  cyst on right past issues in the same area return x 5 months painful touch.  ? ? ?HPI ?Romir Klimowicz presents for follow-up of depression treated with Wellbutrin.  Fortunately it is helping him now as well.  He would like to continue it.  History of inclusion cysts around his ears and has 1 forming on the right ear.  He thinks it may need an I&D.  He is needing a splint for his dropfoot.  Nails on his great toes are thick and uncomfortable.  They have actually fallen off before. ? ?History reviewed. No pertinent past medical history. ? ?Past Surgical History:  ?Procedure Laterality Date  ? CERVICAL LAMINECTOMY    ? HERNIA REPAIR    ? LUMBAR FUSION Bilateral 09/02/2019  ? L4-L5  ? LUMBAR LAMINECTOMY Bilateral 09/02/2019  ? ? ?Family History  ?Problem Relation Age of Onset  ? Cancer Mother   ? Diabetes Maternal Grandmother   ? Hypertension Maternal Grandmother   ? ? ?Social History  ? ?Socioeconomic History  ? Marital status: Single  ?  Spouse name: Not on file  ? Number of children: Not on file  ? Years of education: Not on file  ? Highest education level: Not on file  ?Occupational History  ? Not on file  ?Tobacco Use  ? Smoking status: Every Day  ?  Packs/day: 1.00  ?  Types: Cigarettes  ? Smokeless tobacco: Never  ?Vaping Use  ? Vaping Use: Never used  ?Substance and Sexual Activity  ? Alcohol use: Yes  ?  Alcohol/week: 5.0 standard drinks  ?  Types: 5 Cans of beer per week  ?  Comment: 5 beers three timies a week   ? Drug use: Yes  ?  Types: Cocaine, Marijuana  ?  Comment: last used cocaine 5 days ago   ? Sexual activity: Yes  ?Other Topics Concern  ? Not on file  ?Social History Narrative  ? Not on file  ? ?Social Determinants of Health  ? ?Financial Resource Strain: Not on file  ?Food Insecurity: Not on file   ?Transportation Needs: Not on file  ?Physical Activity: Not on file  ?Stress: Not on file  ?Social Connections: Not on file  ?Intimate Partner Violence: Not on file  ? ? ?Outpatient Medications Prior to Visit  ?Medication Sig Dispense Refill  ? acetaminophen (TYLENOL) 500 MG tablet Take 500 mg by mouth every 6 (six) hours as needed for mild pain.    ? AMBULATORY NON FORMULARY MEDICATION Ankle Foot Orthosis for right leg foot drop.  ?Disp 1. M21.371 1 each 0  ? Misc. Devices MISC AFO for left foot    ? sildenafil (REVATIO) 20 MG tablet May take 3-5 tablets 45 minutes prior to intercourse. No more than 5 tablets daily. 50 tablet 1  ? tamsulosin (FLOMAX) 0.4 MG CAPS capsule Take 1 capsule (0.4 mg total) by mouth daily. 90 capsule 1  ? buPROPion (WELLBUTRIN XL) 300 MG 24 hr tablet Take 1 tablet (300 mg total) by mouth daily. 90 tablet 1  ? tiZANidine (ZANAFLEX) 4 MG tablet Take 4 mg by mouth 3 (three) times daily. (Patient not taking: Reported on 05/03/2021)    ? ?No facility-administered medications prior to visit.  ? ? ?No  Known Allergies ? ?ROS ?Review of Systems  ?Constitutional: Negative.   ?HENT: Negative.    ?Respiratory: Negative.    ?Cardiovascular: Negative.   ?Gastrointestinal: Negative.   ?Genitourinary: Negative.   ?Skin:  Positive for color change. Negative for wound.  ?Neurological:  Positive for weakness. Negative for speech difficulty and light-headedness.  ? ?  ?Objective:  ?  ?Physical Exam ?Vitals and nursing note reviewed.  ?Constitutional:   ?   General: He is not in acute distress. ?   Appearance: Normal appearance. He is not ill-appearing, toxic-appearing or diaphoretic.  ?HENT:  ?   Head: Normocephalic and atraumatic.  ?   Right Ear: External ear normal.  ?   Left Ear: External ear normal.  ?Eyes:  ?   General: No scleral icterus.    ?   Right eye: No discharge.     ?   Left eye: No discharge.  ?   Extraocular Movements: Extraocular movements intact.  ?   Conjunctiva/sclera: Conjunctivae normal.   ?Pulmonary:  ?   Effort: Pulmonary effort is normal.  ?Skin: ?   General: Skin is warm and dry.  ? ?    ?Neurological:  ?   General: No focal deficit present.  ?   Mental Status: He is alert and oriented to person, place, and time.  ?Psychiatric:     ?   Mood and Affect: Mood normal.     ?   Behavior: Behavior normal.  ? ? ?BP 120/80 (BP Location: Left Arm, Patient Position: Sitting, Cuff Size: Normal)   Pulse 60   Temp 98 ?F (36.7 ?C) (Temporal)   Ht 6' (1.829 m)   Wt 195 lb 9.6 oz (88.7 kg)   SpO2 98%   BMI 26.53 kg/m?  ?Wt Readings from Last 3 Encounters:  ?07/02/21 195 lb 9.6 oz (88.7 kg)  ?05/03/21 204 lb (92.5 kg)  ?08/23/20 201 lb 12.8 oz (91.5 kg)  ? ? ? ?Health Maintenance Due  ?Topic Date Due  ? Hepatitis C Screening  Never done  ? COLONOSCOPY (Pts 45-22yr Insurance coverage will need to be confirmed)  Never done  ? Zoster Vaccines- Shingrix (1 of 2) Never done  ? ? ?There are no preventive care reminders to display for this patient. ? ?Lab Results  ?Component Value Date  ? TSH 0.93 05/03/2021  ? ?Lab Results  ?Component Value Date  ? WBC 4.2 08/23/2020  ? HGB 14.3 08/23/2020  ? HCT 43.1 08/23/2020  ? MCV 92.1 08/23/2020  ? PLT 239.0 08/23/2020  ? ?Lab Results  ?Component Value Date  ? NA 143 08/23/2020  ? K 4.4 08/23/2020  ? CO2 24 08/23/2020  ? GLUCOSE 89 08/23/2020  ? BUN 10 08/23/2020  ? CREATININE 0.93 08/23/2020  ? BILITOT 0.4 08/23/2020  ? ALKPHOS 57 08/23/2020  ? AST 18 08/23/2020  ? ALT 18 08/23/2020  ? PROT 6.6 08/23/2020  ? ALBUMIN 4.1 08/23/2020  ? CALCIUM 9.9 08/23/2020  ? GFR 96.45 08/23/2020  ? ?Lab Results  ?Component Value Date  ? CHOL 179 08/23/2020  ? ?Lab Results  ?Component Value Date  ? HDL 36.70 (L) 08/23/2020  ? ?Lab Results  ?Component Value Date  ? LDLCALC 126 (H) 08/23/2020  ? ?Lab Results  ?Component Value Date  ? TRIG 84.0 08/23/2020  ? ?Lab Results  ?Component Value Date  ? CHOLHDL 5 08/23/2020  ? ?No results found for: HGBA1C ? ?  ?Assessment & Plan:  ? ?Problem List  Items Addressed This  Visit   ? ?  ? Musculoskeletal and Integument  ? Onychomycosis of great toe  ? Relevant Orders  ? Ambulatory referral to Podiatry  ?  ? Other  ? Colon cancer screening  ? Relevant Orders  ? Ambulatory referral to Gastroenterology  ? Foot drop, right - Primary  ? Depression, recurrent ([place])  ? Relevant Medications  ? buPROPion (WELLBUTRIN XL) 300 MG 24 hr tablet  ? Need for shingles vaccine  ? Relevant Orders  ? Varicella-zoster vaccine IM  ? Inclusion cyst  ? Relevant Medications  ? doxycycline (VIBRA-TABS) 100 MG tablet  ? ? ?Meds ordered this encounter  ?Medications  ? doxycycline (VIBRA-TABS) 100 MG tablet  ?  Sig: Take 1 tablet (100 mg total) by mouth 2 (two) times daily for 10 days.  ?  Dispense:  20 tablet  ?  Refill:  0  ? buPROPion (WELLBUTRIN XL) 300 MG 24 hr tablet  ?  Sig: Take 1 tablet (300 mg total) by mouth daily.  ?  Dispense:  90 tablet  ?  Refill:  1  ? ? ?Follow-up: Return in about 6 months (around 01/01/2022), or Return for physical exam. Return if cyst doesn't resolve with doxy..  We will continue Wellbutrin.  We will start the Shingrix vaccine series.  He will follow-up with neurosurgery for his needed brace.  Return in 6 months for physical. ? ? ?Libby Maw, MD ?

## 2021-07-03 ENCOUNTER — Encounter: Payer: Self-pay | Admitting: Gastroenterology

## 2021-07-16 ENCOUNTER — Encounter: Payer: Self-pay | Admitting: Podiatrist

## 2021-07-16 ENCOUNTER — Ambulatory Visit: Payer: 59 | Admitting: Podiatrist

## 2021-07-16 DIAGNOSIS — B351 Tinea unguium: Secondary | ICD-10-CM | POA: Diagnosis not present

## 2021-07-16 NOTE — Patient Instructions (Signed)
Onychomycosis/Fungal Toenails ? ?WHAT IS IT? An infection that lies within the keratin of your nail plate that is caused by a fungus. A toenail grows from a root (similar to a hair) and in most cases the fungus will grow out within the nail.  This is the reason most at-home remedies do not work since they are not treating the root of the nail.  ? ? ? ? ?   ? ? ? ?WHY ME? Fungal infections affect all ages, sexes, races, and creeds.  There may be many factors that predispose you to a fungal infection such as age, coexisting medical conditions such as diabetes, or an autoimmune disease; stress, medications, fatigue, genetics, etc.   ? ?Bottom line: fungus thrives in a warm, moist environment and your toes and shoes offer such a location. ? ?IS IT CONTAGIOUS? Theoretically, yes.  You do not want to share shoes, nail clippers or files with someone who has fungal toenails.  Walking around barefoot in the same room or sleeping in the same bed is unlikely to transfer the organism.  It is important to realize, however, that fungus can spread easily from one nail to the next on the same foot.  It can also be transferred from person to person if instruments used to trim nails and cuticles aren't properly sterilized between people and use (nail salons that do not properly sterilize instruments) ? ?IS IT ALWAYS A FUNGUS?  No-  trauma to the nail bed can also cause discoloration and thickening of a toenail. We take a sample of the nail and send it for special testing to determine exactly what organisms (if any) are living in the nail.  If an organism is growing, we will know exactly which one and what medication is best used to treat it.  If no organism is found, it doesn't always mean there is no fungus present-  it can mean there is not enough present for it to grow in lab conditions.  We can still consider treatment, however it is more difficult to determine the best medication and treatment options to try.  ? ?HOW DO WE TREAT  THIS?  There are several ways to treat this condition.  Treatment may depend on many factors such as age, medications, pregnancy, liver and kidney conditions, etc.  Make sure to tell your doctor of any liver or kidney problems before starting any oral antifungal treatments ? ? No treatment.   Unlike many other medical concerns, you can live with this condition.  However for many people this can be a painful condition and may lead to ingrown toenails or a bacterial infection.  It is recommended that you keep the nails cut short to help reduce the amount of fungal nail. ? ?2.   Topical treatment. -  Usually the infection must be mild to use a topical medication (ie hasn't caused thickening of the nail plate).  The topical medicine is applied daily to an un painted nail for the duration of a new nail to grow-    usually 6 to 9 months ? ?3 Oral antifungal medications.  With an 80-90% cure rate, the most common oral medication (Lamisil/ Terbinafine) requires 3 to 4 months of therapy and stays in your system for a year as the new nail grows out from root to tip.  Oral antifungal medications do require blood work to make sure it is a safe drug for you.  A liver function panel will be performed prior to starting the medication  and after the first month of treatment.  It is important to have the blood work performed to avoid any harmful side effects. This medication can have harmful side effects and isn't recommended for all patients, especially those on multiple medications or who have a history of liver or kidney disease. ? ?4   Laser therapy-  A good option for a mild fungal infection.  Usually used in combination with a topical and/or oral antifungals. This is an out of pocket service as insurance will not pay for the procedure as it is deemed "cosmetic".  It is also considered for those who are not candidates for oral therapy.  ? ?5.   Permanent Nail Avulsion.  Removing the entire nail so that a new nail will not grow  back. ? ?

## 2021-07-16 NOTE — Progress Notes (Signed)
?  Chief Complaint  ?Patient presents with  ? Nail Problem  ?  Patient has concerns about bil great toes. Patient great toes are discolored.   ?  ? ?HPI: Patient is 51 y.o. male who presents today for concern over bilateral great toenail thickness. He has tried an antifungal cream on the toes and relates no notable improvement.  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   ? ?Patient Active Problem List  ? Diagnosis Date Noted  ? Need for shingles vaccine 07/02/2021  ? Inclusion cyst 07/02/2021  ? Onychomycosis of great toe 07/02/2021  ? Other fatigue 05/03/2021  ? Benign prostatic hyperplasia with urinary frequency 05/03/2021  ? Abnormal urine findings 08/28/2020  ? Cognitive decline 08/23/2020  ? Depression, recurrent ([place]) 08/23/2020  ? Spondylolisthesis of lumbar region 09/02/2019  ? Foot drop, right 04/20/2019  ? Erectile dysfunction due to arterial insufficiency 04/16/2019  ? Colon cancer screening 04/16/2019  ? Lumbar radiculopathy 04/08/2019  ? Gait instability 04/08/2019  ? ? ?Current Outpatient Medications on File Prior to Visit  ?Medication Sig Dispense Refill  ? buPROPion (WELLBUTRIN XL) 150 MG 24 hr tablet Take by mouth.    ? acetaminophen (TYLENOL) 500 MG tablet Take 500 mg by mouth every 6 (six) hours as needed for mild pain.    ? AMBULATORY NON FORMULARY MEDICATION Ankle Foot Orthosis for right leg foot drop.  ?Disp 1. M21.371 1 each 0  ? buPROPion (WELLBUTRIN XL) 300 MG 24 hr tablet Take 1 tablet (300 mg total) by mouth daily. 90 tablet 1  ? Misc. Devices MISC AFO for left foot    ? sildenafil (REVATIO) 20 MG tablet May take 3-5 tablets 45 minutes prior to intercourse. No more than 5 tablets daily. 50 tablet 1  ? tamsulosin (FLOMAX) 0.4 MG CAPS capsule Take 1 capsule (0.4 mg total) by mouth daily. 90 capsule 1  ? ?No current facility-administered medications on file prior to visit.  ? ? ?No Known Allergies ? ?Review of Systems ?No fevers, chills, nausea, muscle aches, no difficulty  breathing, no calf pain, no chest pain or shortness of breath. ? ? ?Physical Exam ? ?GENERAL APPEARANCE: Alert, conversant. Appropriately groomed. No acute distress.  ? ?VASCULAR: Pedal pulses palpable DP and PT bilateral.  Capillary refill time is immediate to all digits,  Proximal to distal cooling it warm to warm.  Digital perfusion adequate.  ? ?NEUROLOGIC: sensation is intact to 5.07 monofilament at 5/5 sites bilateral.  Light touch is intact bilateral, vibratory sensation intact bilateral ? ?MUSCULOSKELETAL: dropfoot deformity bilateral. Contracture of digits plantarly noted bilateral.  Decreased muscle strength and tone noted bilateral.  ? ?DERMATOLOGIC: skin is warm, supple, and dry. Bilateral hallux nails are thick, discolored, and dystrophic.  Small calluses present sub hallux bilateral.  ? ? ? ?Assessment  ? ?  ICD-10-CM   ?1. Onychomycosis of great toe  B35.1   ?  ? ? ? ?Plan ? ?Discussed exam findings and recommendations.  His great toenails appear mycotic and I recommended sending them off for culture to identify the causitive organism.  When the results are back we will decide on appropriate treatment. I will call him with the result of the culture.  He is awaiting info on his dropfoot brace from his insurance. If I can help with this in any way, he will let me know.   ?

## 2021-07-20 ENCOUNTER — Ambulatory Visit: Payer: 59 | Admitting: Gastroenterology

## 2021-07-24 ENCOUNTER — Ambulatory Visit (INDEPENDENT_AMBULATORY_CARE_PROVIDER_SITE_OTHER): Payer: 59 | Admitting: Gastroenterology

## 2021-07-24 ENCOUNTER — Encounter: Payer: Self-pay | Admitting: Gastroenterology

## 2021-07-24 ENCOUNTER — Telehealth: Payer: Self-pay | Admitting: Family Medicine

## 2021-07-24 VITALS — BP 120/90 | HR 63 | Ht 72.0 in | Wt 191.5 lb

## 2021-07-24 DIAGNOSIS — Z1211 Encounter for screening for malignant neoplasm of colon: Secondary | ICD-10-CM

## 2021-07-24 DIAGNOSIS — Z1212 Encounter for screening for malignant neoplasm of rectum: Secondary | ICD-10-CM

## 2021-07-24 DIAGNOSIS — K5900 Constipation, unspecified: Secondary | ICD-10-CM

## 2021-07-24 MED ORDER — PLENVU 140 G PO SOLR: ORAL | 0 refills | Status: DC

## 2021-07-24 NOTE — Progress Notes (Signed)
? ?HPI : Thomas Rocha is a very pleasant 51 year old male with a history of anxiety and chronic back/neck pain who was referred to [place] by Dr. Abelino Derrick for screening colonoscopy.  Patient reports he has been having trouble with constipation and has had occasional bright red blood per rectum over the past 1 to 2 years.  He states that his trouble with constipation started after his back surgery and June 2021.  But he also thinks that his Wellbutrin is worsening his constipation.  He used to have bowel movement 2-3 times per day.  Currently has bowel movement once a day or every other day.  He frequently has problems with small hard stools and straining with defecation.  He sometimes sees small amounts of bright red blood on the toilet paper when he passes a hard stool.  Sometimes he will go through periods where he goes several days without a bowel movement and gets "blocked up".  When this happens, he traditionally will take magnesium citrate which works very well for him.  However, he is finding that magnesium citrate has been very hard to find recently.  He is also tried Ex-Lax and Pepto-Bismol which do not work well for him.  He takes fiber Gummies, but is not sure they do a lot for him.  Diarrhea is not a typical problem for him.  Constipation scratch that abdominal pain is only a problem for him when his constipation gets bad.  His weight has been stable.  No family history of colon cancer.  He used to take narcotic pain medicines, but was able to get off them about a year ago.  He does take CBD frequently for chronic pain. ? ? ? ? ? ?Past Medical History:  ?Diagnosis Date  ? Anxiety   ? Arthritis   ? ? ? ?Past Surgical History:  ?Procedure Laterality Date  ? CERVICAL LAMINECTOMY    ? HERNIA REPAIR    ? LUMBAR FUSION Bilateral 09/02/2019  ? L4-L5  ? LUMBAR LAMINECTOMY Bilateral 09/02/2019  ? ?Family History  ?Problem Relation Age of Onset  ? Pancreatic cancer Mother   ? Cancer Mother   ? Stroke Mother   ?  Cirrhosis Father   ? Diabetes Maternal Grandmother   ? Hypertension Maternal Grandmother   ? Colon cancer Neg Hx   ? Esophageal cancer Neg Hx   ? Stomach cancer Neg Hx   ? ?Social History  ? ?Tobacco Use  ? Smoking status: Former  ?  Packs/day: 1.00  ?  Types: Cigarettes  ?  Quit date: 2019  ?  Years since quitting: 4.3  ? Smokeless tobacco: Never  ?Vaping Use  ? Vaping Use: Some days  ?Substance Use Topics  ? Alcohol use: Yes  ?  Alcohol/week: 5.0 standard drinks  ?  Types: 5 Cans of beer per week  ?  Comment: 5 beers three times a week  ? Drug use: Yes  ?  Types: Cocaine, Marijuana  ?  Comment: last used cocaine 5 days ago   ? ?Current Outpatient Medications  ?Medication Sig Dispense Refill  ? acetaminophen (TYLENOL) 500 MG tablet Take 500 mg by mouth every 6 (six) hours as needed for mild pain.    ? AMBULATORY NON FORMULARY MEDICATION Ankle Foot Orthosis for right leg foot drop.  ?Disp 1. M21.371 1 each 0  ? buPROPion (WELLBUTRIN XL) 300 MG 24 hr tablet Take 1 tablet (300 mg total) by mouth daily. 90 tablet 1  ? Misc. Devices  MISC AFO for left foot    ? sildenafil (REVATIO) 20 MG tablet May take 3-5 tablets 45 minutes prior to intercourse. No more than 5 tablets daily. 50 tablet 1  ? tamsulosin (FLOMAX) 0.4 MG CAPS capsule Take 1 capsule (0.4 mg total) by mouth daily. 90 capsule 1  ? ?No current facility-administered medications for this visit.  ? ?No Known Allergies ? ? ?Review of Systems: ?All systems reviewed and negative except where noted in HPI.  ? ? ?No results found. ? ?Physical Exam: ?BP 120/90   Pulse 63   Ht 6' (1.829 m)   Wt 191 lb 8 oz (86.9 kg)   SpO2 95%   BMI 25.97 kg/m?  ?Constitutional: Pleasant,well-developed, African-American male in no acute distress. ?HEENT: Normocephalic and atraumatic. Conjunctivae are normal. No scleral icterus. ?Neck supple.  ?Cardiovascular: Normal rate, regular rhythm.  ?Pulmonary/chest: Effort normal and breath sounds normal. No wheezing, rales or  rhonchi. ?Abdominal: Soft, nondistended, nontender. Bowel sounds active throughout. There are no masses palpable. No hepatomegaly. ?Extremities: no edema ?Rectal: Deferred until time of colonoscopy ?Neurological: Alert and oriented to person place and time. ?Skin: Skin is warm and dry. No rashes noted. ?Psychiatric: Normal mood and affect. Behavior is normal. ? ?CBC ?   ?Component Value Date/Time  ? WBC 4.2 08/23/2020 1126  ? RBC 4.68 08/23/2020 1126  ? HGB 14.3 08/23/2020 1126  ? HCT 43.1 08/23/2020 1126  ? PLT 239.0 08/23/2020 1126  ? MCV 92.1 08/23/2020 1126  ? MCHC 33.2 08/23/2020 1126  ? RDW 14.1 08/23/2020 1126  ? ? ?CMP  ?   ?Component Value Date/Time  ? NA 143 08/23/2020 1126  ? K 4.4 08/23/2020 1126  ? CL 108 08/23/2020 1126  ? CO2 24 08/23/2020 1126  ? GLUCOSE 89 08/23/2020 1126  ? BUN 10 08/23/2020 1126  ? CREATININE 0.93 08/23/2020 1126  ? CALCIUM 9.9 08/23/2020 1126  ? PROT 6.6 08/23/2020 1126  ? ALBUMIN 4.1 08/23/2020 1126  ? AST 18 08/23/2020 1126  ? ALT 18 08/23/2020 1126  ? ALKPHOS 57 08/23/2020 1126  ? BILITOT 0.4 08/23/2020 1126  ? ? ? ?ASSESSMENT AND PLAN: ?51 year old male overdue for initial average risk screening colonoscopy.  In the last 2 years, has been bothered by constipation, straining hard stools with occasional bright red blood per rectum.  The bright red blood is almost certainly from internal hemorrhoids.  His constipation may be medication related, but I advised him to stay on the Wellbutrin until he was directed to stop or change therapy by his prescribing provider.  I suggested he take either another form of fiber supplementation on a daily basis (psyllium versus methylcellulose), versus taking MiraLAX on a daily basis.  He can take an as needed laxative such as senna if he goes more than 2 days without a bowel movement. ? ?Colon cancer screening ?- Screening colonoscopy ? ?Constipation ?- Daily MiraLAX versus fiber supplementation ? ?Scant hematochezia, hemorrhoidal ?- Address  constipation as above ? ?The details, risks (including bleeding, perforation, infection, missed lesions, medication reactions and possible hospitalization or surgery if complications occur), benefits, and alternatives to colonoscopy with possible biopsy and possible polypectomy were discussed with the patient and he consents to proceed.  ? ?Lillyann Ahart E. Candis Schatz, MD ?[facility] ? ? ?CC:  Libby Maw,* ? ?

## 2021-07-24 NOTE — Patient Instructions (Addendum)
If you are age 51 or older, your body mass index should be between 23-30. Your Body mass index is 25.97 kg/m?Marland Kitchen If this is out of the aforementioned range listed, please consider follow up with your Primary Care Provider. ? ?If you are age 46 or younger, your body mass index should be between 19-25. Your Body mass index is 25.97 kg/m?Marland Kitchen If this is out of the aformentioned range listed, please consider follow up with your Primary Care Provider.  ? ?You have been scheduled for a colonoscopy. Please follow written instructions given to you at your visit today.  ?Please pick up your prep supplies at the pharmacy within the next 1-3 days. ?If you use inhalers (even only as needed), please bring them with you on the day of your procedure. ? ? ?________________________________________________________ ? ?The [facility] providers would like to encourage you to use [facility] to communicate with providers for non-urgent requests or questions.  Due to long hold times on the telephone, sending your provider a message by [facility] may be a faster and more efficient way to get a response.  Please allow 48 business hours for a response.  Please remember that this is for non-urgent requests.  ?_______________________________________________________ ? ?It was a pleasure to see you today! ? ?Thank you for trusting me with your gastrointestinal care!   ? ?Scott E.Candis Schatz, MD  ? ?

## 2021-07-24 NOTE — Telephone Encounter (Signed)
Pt is wondering if he can have his parking placard updated without having to have an office visit. Please advise pt at [phone] ?

## 2021-07-25 NOTE — Telephone Encounter (Signed)
Form waiting to be signed by Provider. Patient verbally understood if he needs to come in for evaluation someone will give him a call to schedule appointment  ?

## 2021-07-30 ENCOUNTER — Ambulatory Visit (AMBULATORY_SURGERY_CENTER): Payer: 59 | Admitting: Gastroenterology

## 2021-07-30 ENCOUNTER — Encounter: Payer: Self-pay | Admitting: Gastroenterology

## 2021-07-30 VITALS — BP 93/65 | HR 61 | Temp 98.6°F | Resp 17 | Ht 72.0 in | Wt 191.0 lb

## 2021-07-30 DIAGNOSIS — D3A025 Benign carcinoid tumor of the sigmoid colon: Secondary | ICD-10-CM

## 2021-07-30 DIAGNOSIS — K635 Polyp of colon: Secondary | ICD-10-CM

## 2021-07-30 DIAGNOSIS — Z1211 Encounter for screening for malignant neoplasm of colon: Secondary | ICD-10-CM | POA: Diagnosis present

## 2021-07-30 DIAGNOSIS — K621 Rectal polyp: Secondary | ICD-10-CM | POA: Diagnosis not present

## 2021-07-30 DIAGNOSIS — K5900 Constipation, unspecified: Secondary | ICD-10-CM

## 2021-07-30 DIAGNOSIS — D128 Benign neoplasm of rectum: Secondary | ICD-10-CM

## 2021-07-30 DIAGNOSIS — D3A8 Other benign neuroendocrine tumors: Secondary | ICD-10-CM | POA: Diagnosis not present

## 2021-07-30 DIAGNOSIS — D3A026 Benign carcinoid tumor of the rectum: Secondary | ICD-10-CM | POA: Diagnosis not present

## 2021-07-30 MED ORDER — SODIUM CHLORIDE 0.9 % IV SOLN: 500.0000 mL | Freq: Once | INTRAVENOUS | Status: DC

## 2021-07-30 NOTE — Patient Instructions (Signed)
?  Handouts on polyspa and diverticulitis given. ? ? ?YOU HAD AN ENDOSCOPIC PROCEDURE TODAY AT [facility]:   Refer to the procedure report that was given to you for any specific questions about what was found during the examination.  If the procedure report does not answer your questions, please call your gastroenterologist to clarify.  If you requested that your care partner not be given the details of your procedure findings, then the procedure report has been included in a sealed envelope for you to review at your convenience later. ? ?YOU SHOULD EXPECT: Some feelings of bloating in the abdomen. Passage of more gas than usual.  Walking can help get rid of the air that was put into your GI tract during the procedure and reduce the bloating. If you had a lower endoscopy (such as a colonoscopy or flexible sigmoidoscopy) you may notice spotting of blood in your stool or on the toilet paper. If you underwent a bowel prep for your procedure, you may not have a normal bowel movement for a few days. ? ?Please Note:  You might notice some irritation and congestion in your nose or some drainage.  This is from the oxygen used during your procedure.  There is no need for concern and it should clear up in a day or so. ? ?SYMPTOMS TO REPORT IMMEDIATELY: ? ?Following lower endoscopy (colonoscopy or flexible sigmoidoscopy): ? Excessive amounts of blood in the stool ? Significant tenderness or worsening of abdominal pains ? Swelling of the abdomen that is new, acute ? Fever of 100?F or higher ? ? ?For urgent or emergent issues, a gastroenterologist can be reached at any hour by calling [phone]. ?Do not use MyChart messaging for urgent concerns.  ? ? ?DIET:  We do recommend a small meal at first, but then you may proceed to your regular diet.  Drink plenty of fluids but you should avoid alcoholic beverages for 24 hours. ? ?ACTIVITY:  You should plan to take it easy for the rest of today and you should NOT  DRIVE or use heavy machinery until tomorrow (because of the sedation medicines used during the test).   ? ?FOLLOW UP: ?Our staff will call the number listed on your records 48-72 hours following your procedure to check on you and address any questions or concerns that you may have regarding the information given to you following your procedure. If we do not reach you, we will leave a message.  We will attempt to reach you two times.  During this call, we will ask if you have developed any symptoms of COVID 19. If you develop any symptoms (ie: fever, flu-like symptoms, shortness of breath, cough etc.) before then, please call [phone].  If you test positive for Covid 19 in the 2 weeks post procedure, please call and report this information to [place].   ? ?If any biopsies were taken you will be contacted by phone or by letter within the next 1-3 weeks.  Please call us at [phone] if you have not heard about the biopsies in 3 weeks.  ? ? ?SIGNATURES/CONFIDENTIALITY: ?You and/or your care partner have signed paperwork which will be entered into your electronic medical record.  These signatures attest to the fact that that the information above on your After Visit Summary has been reviewed and is understood.  Full responsibility of the confidentiality of this discharge information lies with you and/or your care-partner.  ?

## 2021-07-30 NOTE — Progress Notes (Signed)
PT taken to PACU. Monitors in place. VSS. Report given to RN. 

## 2021-07-30 NOTE — Op Note (Signed)
[place] ?Patient Name: Thomas Rocha ?Procedure Date: 07/30/2021 9:35 AM ?MRN: [id number] ?Endoscopist: Jeanne Terrance E. Candis Schatz , MD ?Age: 51 ?Referring MD:  ?Date of Birth: [birth year] ?Gender: Male ?Account #: [id number] ?Procedure:                Colonoscopy ?Indications:              Screening for colorectal malignant neoplasm, This  ?                          is the patient's first colonoscopy ?Medicines:                Monitored Anesthesia Care ?Procedure:                Pre-Anesthesia Assessment: ?                          - Prior to the procedure, a History and Physical  ?                          was performed, and patient medications and  ?                          allergies were reviewed. The patient's tolerance of  ?                          previous anesthesia was also reviewed. The risks  ?                          and benefits of the procedure and the sedation  ?                          options and risks were discussed with the patient.  ?                          All questions were answered, and informed consent  ?                          was obtained. Prior Anticoagulants: The patient has  ?                          taken no previous anticoagulant or antiplatelet  ?                          agents. ASA Grade Assessment: II - A patient with  ?                          mild systemic disease. After reviewing the risks  ?                          and benefits, the patient was deemed in  ?                          satisfactory condition to undergo the procedure. ?  After obtaining informed consent, the colonoscope  ?                          was passed under direct vision. Throughout the  ?                          procedure, the patient's blood pressure, pulse, and  ?                          oxygen saturations were monitored continuously. The  ?                          Olympus CF-HQ190L (#[id number]) Colonoscope was  ?                          introduced through the anus  and advanced to the the  ?                          terminal ileum, with identification of the  ?                          appendiceal orifice and IC valve. The colonoscopy  ?                          was performed without difficulty. The patient  ?                          tolerated the procedure well. The quality of the  ?                          bowel preparation was adequate. The terminal ileum,  ?                          ileocecal valve, appendiceal orifice, and rectum  ?                          were photographed. The bowel preparation used was  ?                          Plenvu via split dose instruction. ?Scope In: 9:50:52 AM ?Scope Out: 10:13:04 AM ?Scope Withdrawal Time: 0 hours 15 minutes 43 seconds  ?Total Procedure Duration: 0 hours 22 minutes 12 seconds  ?Findings:                 The perianal and digital rectal examinations were  ?                          normal. Pertinent negatives include normal  ?                          sphincter tone and no palpable rectal lesions. ?                          One 4 mm submucosal nodule was found in the  ?  recto-sigmoid colon. Biopsies were taken with a  ?                          cold forceps for histology. Estimated blood loss  ?                          was minimal. ?                          A 3 mm polyp was found in the proximal rectum. The  ?                          polyp was sessile. The polyp was removed with a  ?                          cold biopsy forceps. Resection and retrieval were  ?                          complete. Estimated blood loss was minimal. ?                          Multiple small and large-mouthed diverticula were  ?                          found in the sigmoid colon, descending colon and  ?                          transverse colon. ?                          The exam was otherwise normal throughout the  ?                          examined colon. ?                          The terminal ileum appeared  normal. ?                          The retroflexed view of the distal rectum and anal  ?                          verge was normal and showed no anal or rectal  ?                          abnormalities. ?Complications:            No immediate complications. ?Estimated Blood Loss:     Estimated blood loss was minimal. ?Impression:               - Submucosal nodule in the recto-sigmoid colon.  ?                          Biopsied. 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                          -  One 3 mm polyp in the proximal rectum, removed  ?                          with a cold biopsy forceps. Resected and retrieved. ?                          - Diverticulosis in the sigmoid colon, in the  ?                          descending colon and in the transverse colon. ?                          - The examined portion of the ileum was normal. ?                          - The distal rectum and anal verge are normal on  ?                          retroflexion view. ?Recommendation:           - Patient has a contact number available for  ?                          emergencies. The signs and symptoms of potential  ?                          delayed complications were discussed with the  ?                          patient. Return to normal activities tomorrow.  ?                          Written discharge instructions were provided to the  ?                          patient. ?                          - Resume previous diet. ?                          - Continue present medications. ?                          - Await pathology results. ?                          - Repeat colonoscopy (date not yet determined) for  ?                          surveillance based on pathology results. ?                          - If biopsies confirm carcinoid tumor, will need  ?  EUS + EMR. ?Kagan Mutchler E. Candis Schatz, MD ?07/30/2021 10:22:17 AM ?This report has been signed electronically. ?

## 2021-07-30 NOTE — Progress Notes (Signed)
Called to room to assist during endoscopic procedure.  Patient ID and intended procedure confirmed with present staff. Received instructions for my participation in the procedure from the performing physician.  

## 2021-07-30 NOTE — Progress Notes (Signed)
History and Physical Interval Note: ? ?07/30/2021 ?9:45 AM ? ?Thomas Rocha  has presented today for endoscopic procedure(s), with the diagnosis of  ?Encounter Diagnosis  ?Name Primary?  ? Constipation, unspecified constipation type Yes  ?Marland Kitchen  The various methods of evaluation and treatment have been discussed with the patient and/or family. After consideration of risks, benefits and other options for treatment, the patient has consented to  the endoscopic procedure(s). ? ? The patient's history has been reviewed, patient examined, no change in status, stable for endoscopic procedure(s).  I have reviewed the patient's chart and labs.  Questions were answered to the patient's satisfaction.   ? ? ?Randy Castrejon E. Candis Schatz, MD ?[facility] ? ?

## 2021-07-30 NOTE — Progress Notes (Signed)
VS by CW  Pt's states no medical or surgical changes since previsit or office visit.  

## 2021-07-31 NOTE — Telephone Encounter (Signed)
Form was completed but misplaced spoke with patient regarding this informed patient that new from filled out and signed ready for pick up.  ?

## 2021-07-31 NOTE — Telephone Encounter (Signed)
Pt says he was told to come in to pick up his parking placard and we couldn't find. I didn't see it in the folder. Please advise pt or me concerning this issue [phone] ?

## 2021-08-08 NOTE — Progress Notes (Signed)
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  This lesion is not considered malignant in its current state, but has the potential to become malignant if not removed completely. ?As discussed following your procedure, this lesion will need to be assessed with endoscopic ultrasound (a procedure very similar to the colonoscopy) and likely will be removed with endoscopic mucosal resection (EMR) which is a form of endoscopic polypectomy (using a snare through a colonoscope). ?A CT and further lab studies may also be needed. ?You will be contacted by our office to set up the endoscopic ultrasound and EMR ? ?

## 2021-08-09 ENCOUNTER — Telehealth: Payer: Self-pay | Admitting: Gastroenterology

## 2021-08-09 ENCOUNTER — Telehealth: Payer: Self-pay | Admitting: Podiatrist

## 2021-08-09 NOTE — Telephone Encounter (Signed)
Pt called checking to see if the nail results are back.   I explained that it usually takes a few weeks and he asked if someone could call him please.

## 2021-08-09 NOTE — Telephone Encounter (Signed)
Spoke with [place], specimen ordered 07/16/21 just came in today 08/09/21, takes 3-5 days to process then will release results.

## 2021-08-09 NOTE — Telephone Encounter (Signed)
Inbound call from patient wanting to discuss the Mychart message he got from Dr. Candis Schatz. Please advise.

## 2021-08-09 NOTE — Telephone Encounter (Signed)
Pt wanted to know if he needed to do anything regarding scheduling the EUS. Let pt know he will be contacted by our office regarding this appt. He had no further questions.

## 2021-08-14 NOTE — Telephone Encounter (Signed)
Patient notified and will wait for results.

## 2021-08-16 NOTE — Telephone Encounter (Signed)
Yes,it's here but has not been scanned into epic yet, in your folder. [place] usually does this and she has been out of the office all week.

## 2021-08-21 ENCOUNTER — Other Ambulatory Visit: Payer: Self-pay

## 2021-08-21 ENCOUNTER — Telehealth: Payer: Self-pay

## 2021-08-21 DIAGNOSIS — D3A026 Benign carcinoid tumor of the rectum: Secondary | ICD-10-CM

## 2021-08-21 NOTE — Telephone Encounter (Signed)
Order in for CT of A/P, pt knows to expect a call from rad scheduling to get this set up. Order in for lab. Pt knows to come M-F between the hours of 7:30am-5pm, no appt needed for labs.

## 2021-08-21 NOTE — Telephone Encounter (Signed)
-----   Message from Daryel November, MD sent at 08/16/2021  5:36 PM EDT ----- Regarding: CT, chromogranin A level Vaughan Basta,  Can you please order a  CT abd/pelvis with PO/IV for staging of a small rectal carcinoid tumor and get baseline chromogranin A level?  Thanks

## 2021-08-22 ENCOUNTER — Encounter (INDEPENDENT_AMBULATORY_CARE_PROVIDER_SITE_OTHER): Payer: Self-pay

## 2021-08-22 ENCOUNTER — Telehealth: Payer: Self-pay | Admitting: *Deleted

## 2021-08-22 ENCOUNTER — Other Ambulatory Visit: Payer: Self-pay | Admitting: Podiatrist

## 2021-08-22 MED ORDER — TERBINAFINE HCL 250 MG PO TABS: 250.0000 mg | ORAL_TABLET | Freq: Every day | ORAL | 0 refills | Status: AC

## 2021-08-22 NOTE — Telephone Encounter (Signed)
Called patient giving positive lab culture/recommendations,verbalized understanding, will pick up at pharmacy, said that he did not take a hepatic liver test, should he? Please advise.

## 2021-08-22 NOTE — Telephone Encounter (Signed)
-----   Message from Bronson Ing, DPM sent at 08/22/2021  4:21 PM EDT ----- Regarding: culture result- quick one Last one!!  Could you just call and let this patient know his culture is positive and I have called in the lamisil to his pharmacy.  He will take it daily for 90 days- should be 6 months before he can tell a difference so it takes a while to work through his bloodstream.  Thanks!!!  Dr. Johnette Abraham

## 2021-08-22 NOTE — Progress Notes (Signed)
Fungal culture positive for Onychomycosis. (Paper report will be scanned in)  Reviewed labs and called in lamisil.

## 2021-08-23 ENCOUNTER — Encounter: Payer: Self-pay | Admitting: Podiatrist

## 2021-09-05 ENCOUNTER — Encounter (HOSPITAL_COMMUNITY): Payer: Self-pay

## 2021-09-05 ENCOUNTER — Ambulatory Visit (HOSPITAL_COMMUNITY)
Admission: RE | Admit: 2021-09-05 | Discharge: 2021-09-05 | Disposition: A | Discharge status: Home / Self Care | Payer: 59 | Source: Ambulatory Visit | Attending: Gastroenterology | Admitting: Gastroenterology

## 2021-09-05 DIAGNOSIS — D3A026 Benign carcinoid tumor of the rectum: Secondary | ICD-10-CM | POA: Insufficient documentation

## 2021-09-05 MED ORDER — SODIUM CHLORIDE (PF) 0.9 % IJ SOLN
INTRAMUSCULAR | Status: AC
  Filled 2021-09-05: qty 50

## 2021-09-05 MED ORDER — IOHEXOL 300 MG/ML  SOLN
100.0000 mL | Freq: Once | INTRAMUSCULAR | Status: AC | PRN
Start: 2021-09-05 — End: 2021-09-05
  Administered 2021-09-05: 100 mL via INTRAVENOUS

## 2021-09-07 NOTE — Progress Notes (Signed)
Mr. Cobern,  Your CT did not show any evidence that the carcinoid tumor had spread outside of the rectum.  This is good news.  You had a few small cysts in the kidney and liver which is very common, poses no risk to you and requires no follow up. Please proceed with EUS with Dr. Rush Landmark whenever your procedure is scheduled.

## 2021-09-10 ENCOUNTER — Other Ambulatory Visit: Payer: Self-pay

## 2021-09-10 ENCOUNTER — Telehealth: Payer: Self-pay

## 2021-09-10 DIAGNOSIS — D3A026 Benign carcinoid tumor of the rectum: Secondary | ICD-10-CM

## 2021-09-10 NOTE — Telephone Encounter (Signed)
-----   Message from Irving Copas., MD sent at 09/07/2021  8:27 PM EDT ----- Regarding: FW: Rectal carcinoid Phynix Horton, This is a patient that Dr. Candis Schatz has referred for EUS/EMR of rectal neuroendocrine tumor/carcinoid.  Please proceed with scheduling next available 75-minute lower EUS with EMR.  No clinic visit needed.  Thanks. GM ----- Message ----- From: Irving Copas., MD Sent: 08/16/2021   5:18 PM EDT To: Daryel November, MD Subject: RE: Rectal carcinoid                           SEC, Thanks for sending this referral.  Happy to perform EUS/EMR.  I do obtained at least a chromogranin level and a CT abdomen/pelvis to ensure no evidence of lymphadenopathy.  I do that as a baseline.  We then decide what the follow-up is based on the resection. If the patient is up for it let me know and we can have Latty work on scheduling. Thanks. GM ----- Message ----- From: Daryel November, MD Sent: 08/08/2021   4:43 PM EDT To: Irving Copas., MD Subject: Rectal carcinoid                               GM,  Small rectal carcinoid (5-6 mm) incidentally on routine screening.  Can you EUS/EMR?  Also, do you get CT and 5HIAA/chromogranin on all small rectal NETs?  Thanks,  [place]

## 2021-09-10 NOTE — Telephone Encounter (Signed)
Lower EUS EMR scheduled, pt instructed and medications reviewed.  Patient instructions mailed to home.  Patient to call with any questions or concerns.

## 2021-09-10 NOTE — Telephone Encounter (Signed)
The pt has been scheduled for lower EUS/EMR for 11/15/21 at 730 am at [facility] with GM   Left message on machine to call back

## 2021-10-01 ENCOUNTER — Other Ambulatory Visit: Payer: Self-pay | Admitting: Family Medicine

## 2021-10-01 DIAGNOSIS — N5201 Erectile dysfunction due to arterial insufficiency: Secondary | ICD-10-CM

## 2021-10-01 MED ORDER — SILDENAFIL CITRATE 20 MG PO TABS: ORAL_TABLET | ORAL | 1 refills | Status: DC

## 2021-10-01 NOTE — Telephone Encounter (Signed)
  Encourage patient to contact the pharmacy for refills or they can request refills through [place]:  Please schedule appointment if longer than 1 year  NEXT APPOINTMENT DATE:  MEDICATION:  Is the patient out of medication?   PHARMACY:  Let patient know to contact pharmacy at the end of the day to make sure medication is ready.  Please notify patient to allow 48-72 hours to process  CLINICAL FILLS OUT ALL BELOW:   LAST REFILL:  QTY:  REFILL DATE:    OTHER COMMENTS:    Okay for refill?  Please advise

## 2021-10-01 NOTE — Telephone Encounter (Signed)
  Encourage patient to contact the pharmacy for refills or they can request refills through [place]:  Please schedule appointment if longer than 1 year  NEXT APPOINTMENT DATE:  MEDICATION: sildenafil and neurontin '300mg'$   Is the patient out of medication?   PHARMACY:  [facility] [address] Phone:  [phone]         Let patient know to contact pharmacy at the end of the day to make sure medication is ready.  Please notify patient to allow 48-72 hours to process  CLINICAL FILLS OUT ALL BELOW:   LAST REFILL:  QTY:  REFILL DATE:    OTHER COMMENTS: pt called and stated that he need to be put back on gabapentin because of his nerves   Okay for refill?  Please advise

## 2021-10-01 NOTE — Telephone Encounter (Signed)
Refill request for pending Rx last refill 05/03/21 last OV 07/02/21 please advise.

## 2021-10-23 ENCOUNTER — Encounter: Payer: Self-pay | Admitting: Family Medicine

## 2021-10-23 ENCOUNTER — Ambulatory Visit (INDEPENDENT_AMBULATORY_CARE_PROVIDER_SITE_OTHER): Payer: Commercial Managed Care - HMO | Admitting: Family Medicine

## 2021-10-23 VITALS — BP 116/70 | HR 67 | Temp 97.4°F | Ht 73.0 in | Wt 185.8 lb

## 2021-10-23 DIAGNOSIS — L720 Epidermal cyst: Secondary | ICD-10-CM

## 2021-10-23 DIAGNOSIS — R252 Cramp and spasm: Secondary | ICD-10-CM

## 2021-10-23 DIAGNOSIS — R109 Unspecified abdominal pain: Secondary | ICD-10-CM | POA: Diagnosis not present

## 2021-10-23 NOTE — Progress Notes (Signed)
Established Patient Office Visit  Subjective   Patient ID: Thomas Rocha, male    DOB: March 10, 1971  Age: 51 y.o. MRN: [id number]  Chief Complaint  Patient presents with   Acute Visit    Stomach pain / ache on R side of stomach. Went to ER in [place] 7/29. Growth below waistline & spreading x3 days, bloating, sore Would like to start on gabapentin.    HPI for follow-up of right-sided abdominal pain that is improving but it is still present.  There has been no fever chills nausea or vomiting.  There has been some increase in flatulence.  He took a laxative with mild relief.  He has been seen in the emergency room 2 days ago.  X-rays and extensive blood work were all normal he tells me.  He does have a history of constipation.  Ongoing weakness with foot drop of the left leg.  There are also paresthesias.  He deals with nocturnal muscle cramps.  He uses mustard follow-up is planned later this month with neurology.    Review of Systems  Constitutional: Negative.   HENT: Negative.    Eyes:  Negative for blurred vision, discharge and redness.  Respiratory: Negative.    Cardiovascular: Negative.   Gastrointestinal:  Positive for abdominal pain. Negative for blood in stool, constipation, diarrhea, nausea and vomiting.  Genitourinary: Negative.  Negative for dysuria, frequency, hematuria and urgency.  Musculoskeletal: Negative.  Negative for myalgias.  Skin:  Negative for rash.  Neurological:  Positive for tingling and focal weakness. Negative for loss of consciousness and weakness.  Endo/Heme/Allergies:  Negative for polydipsia.      Objective:     BP 116/70 (BP Location: Right Arm, Patient Position: Sitting, Cuff Size: Normal)   Pulse 67   Temp (!) 97.4 F (36.3 C) (Temporal)   Ht '6\' 1"'$  (1.854 m)   Wt 185 lb 12.8 oz (84.3 kg)   SpO2 97%   BMI 24.51 kg/m    Physical Exam Constitutional:      General: He is not in acute distress.    Appearance: Normal appearance. He is not  ill-appearing, toxic-appearing or diaphoretic.  HENT:     Head: Normocephalic and atraumatic.     Right Ear: External ear normal.     Left Ear: External ear normal.     Mouth/Throat:     Mouth: Mucous membranes are moist.     Pharynx: Oropharynx is clear. No oropharyngeal exudate or posterior oropharyngeal erythema.  Eyes:     General: No scleral icterus.       Right eye: No discharge.        Left eye: No discharge.     Extraocular Movements: Extraocular movements intact.     Conjunctiva/sclera: Conjunctivae normal.     Pupils: Pupils are equal, round, and reactive to light.  Cardiovascular:     Rate and Rhythm: Normal rate and regular rhythm.  Pulmonary:     Effort: Pulmonary effort is normal. No respiratory distress.     Breath sounds: Normal breath sounds.  Abdominal:     General: Bowel sounds are normal.     Tenderness: There is abdominal tenderness. There is no right CVA tenderness or left CVA tenderness.     Hernia: There is no hernia in the left inguinal area or right inguinal area.    Genitourinary:    Penis: Circumcised. No hypospadias, erythema, tenderness, discharge, swelling or lesions.      Testes:  Right: Mass, tenderness or swelling not present. Right testis is descended.        Left: Mass, tenderness or swelling not present. Left testis is descended.     Epididymis:     Right: Not inflamed or enlarged.     Left: Not inflamed or enlarged.  Musculoskeletal:     Cervical back: No rigidity or tenderness.  Lymphadenopathy:     Lower Body: No right inguinal adenopathy. No left inguinal adenopathy.  Skin:    General: Skin is warm and dry.       Neurological:     Mental Status: He is alert and oriented to person, place, and time.  Psychiatric:        Mood and Affect: Mood normal.        Behavior: Behavior normal.      No results found for any visits on 10/23/21.    The 10-year ASCVD risk score (Arnett DK, et al., 2019) is: 4.7%    Assessment &  Plan:   Problem List Items Addressed This Visit       Other   Inclusion cyst   Relevant Orders   Ambulatory referral to General Surgery   Abdominal pain - Primary   Relevant Orders   CBC   Basic metabolic panel   Hepatic function panel   Urinalysis, Routine w reflex microscopic   Amylase   Other Visit Diagnoses     Muscle cramp       Relevant Orders   Magnesium       Return in about 1 week (around 10/30/2021), or if symptoms worsen or fail to improve.  Follow up with neurology as planned.  Could consider trial of Neurontin.  Libby Maw, MD

## 2021-10-24 LAB — HEPATIC FUNCTION PANEL
ALT: 17 U/L (ref 0–53)
AST: 35 U/L (ref 0–37)
Albumin: 4.6 g/dL (ref 3.5–5.2)
Alkaline Phosphatase: 44 U/L (ref 39–117)
Bilirubin, Direct: 0.1 mg/dL (ref 0.0–0.3)
Total Bilirubin: 0.5 mg/dL (ref 0.2–1.2)
Total Protein: 8 g/dL (ref 6.0–8.3)

## 2021-10-24 LAB — URINALYSIS, ROUTINE W REFLEX MICROSCOPIC
Bilirubin Urine: NEGATIVE
Ketones, ur: NEGATIVE
Leukocytes,Ua: NEGATIVE
Nitrite: NEGATIVE
Specific Gravity, Urine: 1.02 (ref 1.000–1.030)
Total Protein, Urine: NEGATIVE
Urine Glucose: NEGATIVE
Urobilinogen, UA: 2 — AB (ref 0.0–1.0)
pH: 6.5 (ref 5.0–8.0)

## 2021-10-24 LAB — CBC
HCT: 43.3 % (ref 39.0–52.0)
Hemoglobin: 14.2 g/dL (ref 13.0–17.0)
MCHC: 32.9 g/dL (ref 30.0–36.0)
MCV: 95.8 fl (ref 78.0–100.0)
Platelets: 181 10*3/uL (ref 150.0–400.0)
RBC: 4.52 Mil/uL (ref 4.22–5.81)
RDW: 14 % (ref 11.5–15.5)
WBC: 4.9 10*3/uL (ref 4.0–10.5)

## 2021-10-24 LAB — BASIC METABOLIC PANEL
BUN: 9 mg/dL (ref 6–23)
CO2: 28 mEq/L (ref 19–32)
Calcium: 9.9 mg/dL (ref 8.4–10.5)
Chloride: 100 mEq/L (ref 96–112)
Creatinine, Ser: 0.91 mg/dL (ref 0.40–1.50)
GFR: 98.19 mL/min (ref >60.00)
Glucose, Bld: 82 mg/dL (ref 70–99)
Potassium: 4.3 mEq/L (ref 3.5–5.1)
Sodium: 137 mEq/L (ref 135–145)

## 2021-10-24 LAB — AMYLASE: Amylase: 28 U/L (ref 27–131)

## 2021-10-24 LAB — MAGNESIUM: Magnesium: 2.1 mg/dL (ref 1.5–2.5)

## 2021-10-25 ENCOUNTER — Telehealth: Payer: Self-pay

## 2021-10-25 DIAGNOSIS — G629 Polyneuropathy, unspecified: Secondary | ICD-10-CM

## 2021-10-25 MED ORDER — GABAPENTIN 300 MG PO CAPS: 300.0000 mg | ORAL_CAPSULE | Freq: Two times a day (BID) | ORAL | 1 refills | Status: DC

## 2021-10-25 NOTE — Telephone Encounter (Signed)
Spoke with patient went over lbs and recommendations patient verbalized understanding. Per patient he mentioned starting back on Gabapentin at his visit yesterday but the pharmacy have not received this would like Rx sent in. Please advise.

## 2021-11-08 ENCOUNTER — Encounter (HOSPITAL_COMMUNITY): Payer: Self-pay | Admitting: Gastroenterology

## 2021-11-12 NOTE — OR Nursing (Signed)
Patient called saying he can't find prep info for his procedure on thurs 8/24, so I messaged Dr M's scheduler asking someone to call him. He also said he saw MD beginning of month for right groin area bulge, that gets painful but that he can push on it and it goes back in. Looks like MD faxed info to [facility] but he has not heard from anyone. I told him to call his MD about it and ask if [facility] will call him or he should call them for appt. Dr Jerilynn Mages said the groin (prob inguinal hernia) would not interfere with procedure on Thurs.

## 2021-11-14 NOTE — Anesthesia Preprocedure Evaluation (Signed)
Anesthesia Evaluation  Patient identified by MRN, date of birth, ID band Patient awake    Reviewed: Allergy & Precautions, NPO status , Patient's Chart, lab work & pertinent test results  History of Anesthesia Complications Negative for: history of anesthetic complications  Airway Mallampati: II  TM Distance: >3 FB Neck ROM: Full    Dental  (+) Dental Advisory Given, Teeth Intact   Pulmonary former smoker,    Pulmonary exam normal        Cardiovascular negative cardio ROS Normal cardiovascular exam     Neuro/Psych PSYCHIATRIC DISORDERS Anxiety Depression  Neuromuscular disease    GI/Hepatic negative GI ROS, Neg liver ROS,   Endo/Other  negative endocrine ROS  Renal/GU negative Renal ROS     Musculoskeletal  (+) Arthritis ,   Abdominal   Peds  Hematology negative hematology ROS (+)   Anesthesia Other Findings   Reproductive/Obstetrics                            Anesthesia Physical Anesthesia Plan  ASA: 3  Anesthesia Plan: MAC   Post-op Pain Management: Minimal or no pain anticipated   Induction:   PONV Risk Score and Plan: 1 and Propofol infusion and Treatment may vary due to age or medical condition  Airway Management Planned: Nasal Cannula and Natural Airway  Additional Equipment: None  Intra-op Plan:   Post-operative Plan:   Informed Consent: I have reviewed the patients History and Physical, chart, labs and discussed the procedure including the risks, benefits and alternatives for the proposed anesthesia with the patient or authorized representative who has indicated his/her understanding and acceptance.       Plan Discussed with: CRNA and Anesthesiologist  Anesthesia Plan Comments:        Anesthesia Quick Evaluation

## 2021-11-15 ENCOUNTER — Ambulatory Visit (HOSPITAL_COMMUNITY): Payer: Commercial Managed Care - HMO | Admitting: Anesthesiology

## 2021-11-15 ENCOUNTER — Ambulatory Visit (HOSPITAL_BASED_OUTPATIENT_CLINIC_OR_DEPARTMENT_OTHER): Payer: Commercial Managed Care - HMO | Admitting: Anesthesiology

## 2021-11-15 ENCOUNTER — Encounter (HOSPITAL_COMMUNITY)
Admission: RE | Disposition: A | Discharge status: Home / Self Care | Payer: Self-pay | Source: Home / Self Care | Attending: Gastroenterology

## 2021-11-15 ENCOUNTER — Other Ambulatory Visit: Payer: Self-pay

## 2021-11-15 ENCOUNTER — Encounter (HOSPITAL_COMMUNITY): Payer: Self-pay | Admitting: Gastroenterology

## 2021-11-15 ENCOUNTER — Ambulatory Visit (HOSPITAL_COMMUNITY)
Admission: RE | Admit: 2021-11-15 | Discharge: 2021-11-15 | Disposition: A | Discharge status: Home / Self Care | Payer: Commercial Managed Care - HMO | Attending: Gastroenterology | Admitting: Gastroenterology

## 2021-11-15 DIAGNOSIS — F418 Other specified anxiety disorders: Secondary | ICD-10-CM

## 2021-11-15 DIAGNOSIS — Z87891 Personal history of nicotine dependence: Secondary | ICD-10-CM | POA: Diagnosis not present

## 2021-11-15 DIAGNOSIS — K6289 Other specified diseases of anus and rectum: Secondary | ICD-10-CM

## 2021-11-15 DIAGNOSIS — K641 Second degree hemorrhoids: Secondary | ICD-10-CM

## 2021-11-15 DIAGNOSIS — D3A026 Benign carcinoid tumor of the rectum: Secondary | ICD-10-CM

## 2021-11-15 DIAGNOSIS — G709 Myoneural disorder, unspecified: Secondary | ICD-10-CM | POA: Insufficient documentation

## 2021-11-15 DIAGNOSIS — I899 Noninfective disorder of lymphatic vessels and lymph nodes, unspecified: Secondary | ICD-10-CM

## 2021-11-15 DIAGNOSIS — M1990 Unspecified osteoarthritis, unspecified site: Secondary | ICD-10-CM | POA: Diagnosis not present

## 2021-11-15 DIAGNOSIS — K6389 Other specified diseases of intestine: Secondary | ICD-10-CM | POA: Diagnosis not present

## 2021-11-15 DIAGNOSIS — D3A8 Other benign neuroendocrine tumors: Secondary | ICD-10-CM | POA: Insufficient documentation

## 2021-11-15 HISTORY — PX: ENDOSCOPIC MUCOSAL RESECTION: SHX6839

## 2021-11-15 HISTORY — PX: SUBMUCOSAL TATTOO INJECTION: SHX6856

## 2021-11-15 HISTORY — PX: FLEXIBLE SIGMOIDOSCOPY: SHX5431

## 2021-11-15 HISTORY — PX: HEMOSTASIS CLIP PLACEMENT: SHX6857

## 2021-11-15 HISTORY — PX: EUS: SHX5427

## 2021-11-15 HISTORY — PX: SUBMUCOSAL LIFTING INJECTION: SHX6855

## 2021-11-15 SURGERY — SIGMOIDOSCOPY, FLEXIBLE
Anesthesia: Monitor Anesthesia Care

## 2021-11-15 MED ORDER — PROPOFOL 10 MG/ML IV BOLUS
INTRAVENOUS | Status: DC | PRN
  Administered 2021-11-15: 20 mg via INTRAVENOUS

## 2021-11-15 MED ORDER — EPHEDRINE SULFATE-NACL 50-0.9 MG/10ML-% IV SOSY
PREFILLED_SYRINGE | INTRAVENOUS | Status: DC | PRN
  Administered 2021-11-15: 10 mg via INTRAVENOUS

## 2021-11-15 MED ORDER — PHENYLEPHRINE 80 MCG/ML (10ML) SYRINGE FOR IV PUSH (FOR BLOOD PRESSURE SUPPORT)
PREFILLED_SYRINGE | INTRAVENOUS | Status: DC | PRN
  Administered 2021-11-15 (×3): 80 ug via INTRAVENOUS

## 2021-11-15 MED ORDER — PROPOFOL 10 MG/ML IV BOLUS
INTRAVENOUS | Status: AC
  Filled 2021-11-15: qty 20

## 2021-11-15 MED ORDER — SODIUM CHLORIDE 0.9 % IV SOLN: INTRAVENOUS | Status: DC

## 2021-11-15 MED ORDER — SPOT INK MARKER SYRINGE KIT
PACK | SUBMUCOSAL | Status: AC
  Filled 2021-11-15: qty 5

## 2021-11-15 MED ORDER — LACTATED RINGERS IV SOLN: INTRAVENOUS | Status: DC

## 2021-11-15 MED ORDER — PROPOFOL 500 MG/50ML IV EMUL
INTRAVENOUS | Status: DC | PRN
  Administered 2021-11-15: 135 ug/kg/min via INTRAVENOUS

## 2021-11-15 MED ORDER — SPOT INK MARKER SYRINGE KIT
PACK | SUBMUCOSAL | Status: DC | PRN
  Administered 2021-11-15: 1 mL via SUBMUCOSAL

## 2021-11-15 NOTE — Anesthesia Procedure Notes (Signed)
Procedure Name: MAC Date/Time: 11/15/2021 7:42 AM  Performed by: Niel Hummer, CRNAPre-anesthesia Checklist: Patient identified, Emergency Drugs available, Suction available and Patient being monitored Oxygen Delivery Method: Simple face mask

## 2021-11-15 NOTE — H&P (Signed)
GASTROENTEROLOGY PROCEDURE H&P NOTE   Primary Care Physician: Libby Maw, MD  HPI: Thomas Rocha is a 51 y.o. 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  Past Medical History:  Diagnosis Date   Anxiety    Arthritis    Past Surgical History:  Procedure Laterality Date   CERVICAL LAMINECTOMY     HERNIA REPAIR     LUMBAR FUSION Bilateral 09/02/2019   L4-L5   LUMBAR LAMINECTOMY Bilateral 09/02/2019   Current Facility-Administered Medications  Medication Dose Route Frequency Provider Last Rate Last Admin   0.9 %  sodium chloride infusion   Intravenous Continuous Mansouraty, Telford Nab., MD       lactated ringers infusion   Intravenous Continuous Mansouraty, Telford Nab., MD 10 mL/hr at 11/15/21 0705 New Bag at 11/15/21 0705    Current Facility-Administered Medications:    0.9 %  sodium chloride infusion, , Intravenous, Continuous, Mansouraty, Telford Nab., MD   lactated ringers infusion, , Intravenous, Continuous, Mansouraty, Telford Nab., MD, Last Rate: 10 mL/hr at 11/15/21 0705, New Bag at 11/15/21 0705 No Known Allergies Family History  Problem Relation Age of Onset   Pancreatic cancer Mother    Cancer Mother    Stroke Mother    Cirrhosis Father    Diabetes Maternal Grandmother    Hypertension Maternal Grandmother    Colon cancer Neg Hx    Esophageal cancer Neg Hx    Stomach cancer Neg Hx    Rectal cancer Neg Hx    Social History   Socioeconomic History   Marital status: Single    Spouse name: Not on file   Number of children: Not on file   Years of education: Not on file   Highest education level: Not on file  Occupational History   Not on file  Tobacco Use   Smoking status: Former    Packs/day: 1.00    Types: Cigarettes    Quit date: 2019    Years since quitting: 4.6   Smokeless tobacco: Never  Vaping Use   Vaping Use: Some days  Substance and  Sexual Activity   Alcohol use: Yes    Alcohol/week: 5.0 standard drinks of alcohol    Types: 5 Cans of beer per week    Comment: 5 beers three times a week   Drug use: Not Currently    Types: Cocaine, Marijuana   Sexual activity: Yes  Other Topics Concern   Not on file  Social History Narrative   Not on file   Social Determinants of Health   Financial Resource Strain: Not on file  Food Insecurity: Not on file  Transportation Needs: Not on file  Physical Activity: Not on file  Stress: Not on file  Social Connections: Not on file  Intimate Partner Violence: Not on file    Physical Exam: Today's Vitals   11/15/21 0648  BP: 119/75  Pulse: 61  Resp: 12  Temp: 97.6 F (36.4 C)  TempSrc: Temporal  SpO2: 100%  Weight: 81.6 kg  Height: 6' (1.829 m)  PainSc: 0-No pain   Body mass index is 24.41 kg/m. GEN: NAD EYE: Sclerae anicteric ENT: MMM CV: Non-tachycardic GI: Soft, NT/ND NEURO:  Alert & Oriented x 3  Lab Results: No results for input(s): "WBC", "HGB", "HCT", "PLT" in the last 72 hours. BMET No results for input(s): "NA", "K", "CL", "CO2", "GLUCOSE", "BUN", "CREATININE", "CALCIUM" in the last 72 hours. LFT No results  for input(s): "PROT", "ALBUMIN", "AST", "ALT", "ALKPHOS", "BILITOT", "BILIDIR", "IBILI" in the last 72 hours. PT/INR No results for input(s): "LABPROT", "INR" in the last 72 hours.   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  The risks of an EUS including intestinal perforation, bleeding, infection, aspiration, and medication effects were discussed as was the possibility it may not give a definitive diagnosis if a biopsy is performed.   The risks and benefits of endoscopic evaluation/treatment were discussed with the patient and/or family; these include but are not limited to the risk of perforation, infection,  bleeding, missed lesions, lack of diagnosis, severe illness requiring hospitalization, as well as anesthesia and sedation related illnesses.  The patient's history has been reviewed, patient examined, no change in status, and deemed stable for procedure.  The patient and/or family is agreeable to proceed.    Justice Britain, MD [facility] Office # [phone]

## 2021-11-15 NOTE — Discharge Instructions (Signed)

## 2021-11-15 NOTE — Anesthesia Postprocedure Evaluation (Signed)
Anesthesia Post Note  Patient: Thomas Rocha  Procedure(s) Performed: LOWER ENDOSCOPIC ULTRASOUND (EUS) ENDOSCOPIC MUCOSAL RESECTION FLEXIBLE SIGMOIDOSCOPY SUBMUCOSAL LIFTING INJECTION HEMOSTASIS CLIP PLACEMENT SUBMUCOSAL TATTOO INJECTION     Patient location during evaluation: PACU Anesthesia Type: MAC Level of consciousness: awake and alert Pain management: pain level controlled Vital Signs Assessment: post-procedure vital signs reviewed and stable Respiratory status: spontaneous breathing, nonlabored ventilation and respiratory function stable Cardiovascular status: stable and blood pressure returned to baseline Anesthetic complications: no   No notable events documented.  Last Vitals:  Vitals:   11/15/21 0900 11/15/21 0910  BP: 121/74 123/71  Pulse: (!) 44 (!) 57  Resp: 10 12  Temp:    SpO2: 98% 100%    Last Pain:  Vitals:   11/15/21 0910  TempSrc:   PainSc: 0-No pain                 Audry Pili

## 2021-11-15 NOTE — Op Note (Signed)
[facility] Patient Name: Thomas Rocha Procedure Date: 11/15/2021 MRN: [id number] Attending MD: Justice Britain , MD Date of Birth: 22-Feb-1971 CSN: [id number] Age: 51 Admit Type: Outpatient Procedure:                Lower EUS Indications:              Rectal deformity found on endoscopy; subepithelial                            tumor versus extrinsic compression, Neuroendocrine                            Tumor Providers:                Justice Britain, MD, Carlyn Reichert, RN, [facility] Technician, Technician Referring MD:             Gladstone Pih. Candis Schatz, MD Medicines:                Monitored Anesthesia Care Complications:            No immediate complications. Estimated Blood Loss:     Estimated blood loss was minimal. Procedure:                Pre-Anesthesia Assessment:                           - Prior to the procedure, a History and Physical                            was performed, and patient medications and                            allergies were reviewed. The patient's tolerance of                            previous anesthesia was also reviewed. The risks                            and benefits of the procedure and the sedation                            options and risks were discussed with the patient.                            All questions were answered, and informed consent                            was obtained. Prior Anticoagulants: The patient has                            taken no previous anticoagulant or antiplatelet                            agents. ASA Grade  Assessment: II - A patient with                            mild systemic disease. After reviewing the risks                            and benefits, the patient was deemed in                            satisfactory condition to undergo the procedure.                           After obtaining informed consent, the endoscope was                             passed under direct vision. Throughout the                            procedure, the patient's blood pressure, pulse, and                            oxygen saturations were monitored continuously. The                            [id number] ([id number]) Olympus therapeutic endoscope                            was introduced through the anus and advanced to the                            the descending colon. The [id number] ([id number])                            Olympus radial ultrasound scope was introduced                            through the anus and advanced to the the sigmoid                            colon for ultrasound. The lower EUS was                            accomplished without difficulty. The patient                            tolerated the procedure. The quality of the bowel                            preparation was adequate. Scope In: 7:50:36 AM Scope Out: 8:36:13 AM Total Procedure Duration: 0 hours 45 minutes 37 seconds  Findings:      The digital rectal exam findings include hemorrhoids. Pertinent       negatives include no palpable rectal lesions.      ENDOSCOPIC FINDING: :      A  large amount of semi-liquid semi-solid stool was found in the entire       colon, interfering with visualization. Lavage of the area was performed       using copious amounts (for 15 minutes as well as use of a Jabier Mutton net to       remove larger formed stool), resulting in clearance with adequate       visualization eventually.      One 8 mm submucosal nodule was found in the recto-sigmoid colon (18-19       cm from anal os). After EUS was completed, preparations were made for       mucosal resection attempt. NBI imaging and White-light endoscopy was       done to demarcate the borders of the lesion. Everlift was injected to       raise the lesion. Cap band ligator and snare mucosal resection was       performed. Resection and retrieval were complete. To prevent bleeding       after mucosal  resection, three hemostatic clips were successfully placed       (MR conditional). There was no bleeding during, or at the end, of the       procedure. Area distal and contralateral to the resection was tattooed       with an injection of Spot (carbon black).      Normal mucosa was found in the entire colon otherwise.      Non-bleeding non-thrombosed internal hemorrhoids were found during       retroflexion, during perianal exam and during digital exam. The       hemorrhoids were Grade II (internal hemorrhoids that prolapse but reduce       spontaneously).      ENDOSONOGRAPHIC FINDING: :      A round intramural (subepithelial) lesion was found at the rectosigmoid       junction. The lesion was hypoechoic. Sonographically, the origin       appeared to be within the deep mucosa (Layer 2). The lesion measured up       to 9 mm in thickness. The endosonographic borders were smooth.      The rectum was normal.      No malignant-appearing lymph nodes were visualized in the perirectal       region and in the left iliac region. The nodes were.      The internal anal sphincter was visualized endosonographically and       appeared normal. Impression:               FLEX Impression:                           - Hemorrhoids found on digital rectal exam.                           - Stool in the entire examined colon - lavaged and                            roth net used for adequate visualization.                           - Submucosal nodule in the recto-sigmoid colon.  After EUS, Cap-band lift ligation mucosal resection                            performed. Clips (MR conditional) were placed.                            Tattooed distally and contralateral wall.                           - Normal mucosa in the entire examined colon.                           - Non-bleeding non-thrombosed internal hemorrhoids.                           EUS Impression:                            - An intramural (subepithelial) lesion was                            visualized endosonographically at the rectosigmoid                            junction. Sonographically, the origin appeared to                            be within the deep mucosa (Layer 2). Tissue was                            obtained from this exam, and results are pending.                            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                           - Endosonographic images of the rectum were                            unremarkable.                           - No malignant-appearing lymph nodes were                            visualized endosonographically in the perirectal                            region and in the left iliac region.                           - The internal anal sphincter was visualized  endosonographically and appeared normal. Moderate Sedation:      Not Applicable - Patient had care per Anesthesia. Recommendation:           - The patient will be observed post-procedure,                            until all discharge criteria are met.                           - Discharge patient to home.                           - Patient has a contact number available for                            emergencies. The signs and symptoms of potential                            delayed complications were discussed with the                            patient. Return to normal activities tomorrow.                            Written discharge instructions were provided to the                            patient.                           - Resume previous diet.                           - Await path results.                           - Repeat lower endoscopic ultrasound in 1 year for                            surveillance will likely be recommended, if                            complete resection is noted. As  this is not a                            rectal NET, will likely discuss at a [place] to                            consider overall follow up as well.                           - The findings and recommendations were discussed                            with the patient.                           -  The findings and recommendations were discussed                            with the patient's family. Procedure Code(s):        --- Professional ---                           [phone], Sigmoidoscopy, flexible; with endoscopic                            mucosal resection                           54008, Sigmoidoscopy, flexible; with endoscopic                            ultrasound examination Diagnosis Code(s):        --- Professional ---                           K64.1, Second degree hemorrhoids                           K63.89, Other specified diseases of intestine                           I89.9, Noninfective disorder of lymphatic vessels                            and lymph nodes, unspecified                           K62.89, Other specified diseases of anus and rectum CPT copyright 2019 American Medical Association. All rights reserved. The codes documented in this report are preliminary and upon coder review may  be revised to meet current compliance requirements. Justice Britain, MD 11/15/2021 9:00:51 AM Number of Addenda: 0

## 2021-11-15 NOTE — Transfer of Care (Signed)
Immediate Anesthesia Transfer of Care Note  Patient: Thomas Rocha  Procedure(s) Performed: LOWER ENDOSCOPIC ULTRASOUND (EUS) ENDOSCOPIC MUCOSAL RESECTION FLEXIBLE SIGMOIDOSCOPY SUBMUCOSAL LIFTING INJECTION HEMOSTASIS CLIP PLACEMENT SUBMUCOSAL TATTOO INJECTION  Patient Location: PACU  Anesthesia Type:MAC  Level of Consciousness: drowsy  Airway & Oxygen Therapy: Patient Spontanous Breathing and Patient connected to face mask oxygen  Post-op Assessment: Report given to RN, Post -op Vital signs reviewed and stable and Patient moving all extremities X 4  Post vital signs: Reviewed and stable  Last Vitals:  Vitals Value Taken Time  BP 99/56   Temp    Pulse 51 11/15/21 0841  Resp 15 11/15/21 0841  SpO2 100 % 11/15/21 0841  Vitals shown include unvalidated device data.  Last Pain:  Vitals:   11/15/21 0648  TempSrc: Temporal  PainSc: 0-No pain         Complications: No notable events documented.

## 2021-11-18 ENCOUNTER — Encounter (HOSPITAL_COMMUNITY): Payer: Self-pay | Admitting: Gastroenterology

## 2021-11-20 NOTE — Progress Notes (Signed)
COVID Vaccine Completed:  Yes  Date of COVID positive in last 90 days:  PCP - Abelino Derrick, MD Cardiologist -   Chest x-ray -  EKG -  Stress Test -  ECHO -  Cardiac Cath -  Pacemaker/ICD device last checked: Spinal Cord Stimulator:  Bowel Prep -   Sleep Study -  CPAP -   Fasting Blood Sugar -  Checks Blood Sugar _____ times a day  Blood Thinner Instructions: Aspirin Instructions: Last Dose:  Activity level:  Can go up a flight of stairs and perform activities of daily living without stopping and without symptoms of chest pain or shortness of breath.  Able to exercise without symptoms  Unable to go up a flight of stairs without symptoms of     Anesthesia review:   Patient denies shortness of breath, fever, cough and chest pain at PAT appointment  Patient verbalized understanding of instructions that were given to them at the PAT appointment. Patient was also instructed that they will need to review over the PAT instructions again at home before surgery.

## 2021-11-20 NOTE — Patient Instructions (Addendum)
SURGICAL WAITING ROOM VISITATION Patients having surgery or a procedure may have no more than 2 support people in the waiting area - these visitors may rotate.   Children under the age of 59 must have an adult with them who is not the patient. If the patient needs to stay at the hospital during part of their recovery, the visitor guidelines for inpatient rooms apply. Pre-op nurse will coordinate an appropriate time for 1 support person to accompany patient in pre-op.  This support person may not rotate.    Please refer to the [facility] website for the visitor guidelines for Inpatients (after your surgery is over and you are in a regular room).      Your procedure is scheduled on: 11-23-21   Report to [facility] Main Entrance    Report to admitting at 10:15 AM   Call this number if you have problems the morning of surgery [phone]   Do not eat food :After Midnight.   After Midnight you may have the following liquids until 9:30 AM/ DAY OF SURGERY  Water Non-Citrus Juices (without pulp, NO RED) Carbonated Beverages Black Coffee (NO MILK/CREAM OR CREAMERS, sugar ok)  Clear Tea (NO MILK/CREAM OR CREAMERS, sugar ok) regular and decaf                             Plain Jell-O (NO RED)                                           Fruit ices (not with fruit pulp, NO RED)                                     Popsicles (NO RED)                                                               Sports drinks like Gatorade (NO RED)                   The day of surgery:  Drink ONE (1) Pre-Surgery Clear Ensure at 9:30 AM the morning of surgery. Drink in one sitting. Do not sip.  This drink was given to you during your hospital  pre-op appointment visit. Nothing else to drink after completing the Pre-Surgery Clear Ensure         If you have questions, please contact your surgeon's office.   FOLLOW ANY ADDITIONAL PRE OP INSTRUCTIONS YOU RECEIVED FROM YOUR SURGEON'S OFFICE!!!     Oral  Hygiene is also important to reduce your risk of infection.                                    Remember - BRUSH YOUR TEETH THE MORNING OF SURGERY WITH YOUR REGULAR TOOTHPASTE   Do NOT smoke after Midnight   Take these medicines the morning of surgery with A SIP OF WATER:  Bupropion, Gabapentin  You may not have any metal on your body including  jewelry, and body piercing             Do not wear  lotions, powders, cologne, or deodorant              Men may shave face and neck.   Do not bring valuables to the hospital. [place].   Contacts, dentures or bridgework may not be worn into surgery.  DO NOT [place]. PHARMACY WILL DISPENSE MEDICATIONS LISTED ON YOUR MEDICATION LIST TO YOU DURING YOUR ADMISSION [place]!   Patients discharged on the day of surgery will not be allowed to drive home.  Someone NEEDS to stay with you for the first 24 hours after anesthesia.  Special Instructions: Bring a copy of your healthcare power of attorney and living will documents the day of surgery if you haven't scanned them before.  Please read over the following fact sheets you were given: IF YOU HAVE QUESTIONS ABOUT YOUR PRE-OP INSTRUCTIONS PLEASE CALL [place] - Preparing for Surgery Before surgery, you can play an important role.  Because skin is not sterile, your skin needs to be as free of germs as possible.  You can reduce the number of germs on your skin by washing with CHG (chlorahexidine gluconate) soap before surgery.  CHG is an antiseptic cleaner which kills germs and bonds with the skin to continue killing germs even after washing. Please DO NOT use if you have an allergy to CHG or antibacterial soaps.  If your skin becomes reddened/irritated stop using the CHG and inform your nurse when you arrive at Short Stay. Do not shave (including legs and underarms) for at  least 48 hours prior to the first CHG shower.  You may shave your face/neck.  Please follow these instructions carefully:  1.  Shower with CHG Soap the night before surgery and the  morning of surgery.  2.  If you choose to wash your hair, wash your hair first as usual with your normal  shampoo.  3.  After you shampoo, rinse your hair and body thoroughly to remove the shampoo.                             4.  Use CHG as you would any other liquid soap.  You can apply chg directly to the skin and wash.  Gently with a scrungie or clean washcloth.  5.  Apply the CHG Soap to your body ONLY FROM THE NECK DOWN.   Do   not use on face/ open                           Wound or open sores. Avoid contact with eyes, ears mouth and   genitals (private parts).                       Wash face,  Genitals (private parts) with your normal soap.             6.  Wash thoroughly, paying special attention to the area where your    surgery  will be performed.  7.  Thoroughly rinse your body with warm water from the neck down.  8.  DO NOT shower/wash with your normal soap after using and rinsing  off the CHG Soap.                9.  Pat yourself dry with a clean towel.            10.  Wear clean pajamas.            11.  Place clean sheets on your bed the night of your first shower and do not  sleep with pets. Day of Surgery : Do not apply any lotions/deodorants the morning of surgery.  Please wear clean clothes to the hospital/surgery center.  FAILURE TO FOLLOW THESE INSTRUCTIONS MAY RESULT IN THE CANCELLATION OF YOUR SURGERY  PATIENT SIGNATURE_________________________________  NURSE SIGNATURE__________________________________  ________________________________________________________________________

## 2021-11-20 NOTE — Progress Notes (Signed)
Surgery orders requested via Epic inbox. °

## 2021-11-21 ENCOUNTER — Encounter: Payer: Self-pay | Admitting: Gastroenterology

## 2021-11-21 ENCOUNTER — Other Ambulatory Visit: Payer: Self-pay

## 2021-11-21 ENCOUNTER — Encounter (HOSPITAL_COMMUNITY)
Admission: RE | Admit: 2021-11-21 | Discharge: 2021-11-21 | Disposition: A | Discharge status: Home / Self Care | Payer: Commercial Managed Care - HMO | Source: Ambulatory Visit | Attending: Surgery | Admitting: Surgery

## 2021-11-21 ENCOUNTER — Encounter (HOSPITAL_COMMUNITY): Payer: Self-pay

## 2021-11-21 DIAGNOSIS — Z01818 Encounter for other preprocedural examination: Secondary | ICD-10-CM | POA: Insufficient documentation

## 2021-11-21 HISTORY — DX: Foot drop, unspecified foot: M21.379

## 2021-11-21 HISTORY — DX: Gastro-esophageal reflux disease without esophagitis: K21.9

## 2021-11-21 HISTORY — DX: Depression, unspecified: F32.A

## 2021-11-21 LAB — SURGICAL PATHOLOGY

## 2021-11-21 NOTE — H&P (Signed)
PROVIDER: Joya San, MD  MRN: [id number] DOB: [birth year] DATE OF ENCOUNTER: 11/14/2021  Subjective   Chief Complaint: New Consultation (Hernia?)   History of Present Illness: Thomas Rocha is a 51 y.o. male who is seen today as an office consultation at the request of Dr. Pasty Arch for evaluation of New Consultation (Hernia?) .   He has a fairly new right inguinal hernia that popped out in the last 2 months. Is gotten fairly big and painful. He is scheduled for a ultrasound and colonoscopy tomorrow for carcinoid by [facility].  He also has a 1 cm not on his left cheek near his mouth. He has a beard there but I can feel it and feels consistent with an epidermal cyst.  I described open right inguinal hernia repair on him. He has had a prior open hiatal hernia repair at [facility] in [place] and has a upper midline incision. I think it might be problematic and docking the robot and trying to do this in that regard. 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 Rush Landmark.  Review of Systems: See HPI as well for other ROS.  ROS   Medical History: No past medical history on file.  Patient Active Problem List  Diagnosis  Abdominal pain  Abnormal urine findings  Benign prostatic hyperplasia with urinary frequency  Cognitive decline  Colon cancer screening  Depression, recurrent (CMS-HCC)  Erectile dysfunction due to arterial insufficiency  Foot drop, right  Gait instability  Inclusion cyst  Lumbar radiculopathy  Need for shingles vaccine  Onychomycosis of great toe  Other fatigue  S/P lumbar fusion  Spondylolisthesis of lumbar region   No past surgical history on file.   No Known Allergies  Current Outpatient Medications on File Prior to  Visit  Medication Sig Dispense Refill  buPROPion (WELLBUTRIN XL) 300 MG XL tablet Take 1 tablet by mouth once daily  gabapentin (NEURONTIN) 300 MG capsule Take 300 mg by mouth 3 (three) times daily  sildenafil (REVATIO) 20 mg tablet May take 3-5 tablets 45 minutes prior to intercourse. No more than 5 tablets daily.  tamsulosin (FLOMAX) 0.4 mg capsule Take by mouth  terbinafine HCL (LAMISIL) 250 mg tablet Take 250 mg by mouth once daily   No current facility-administered medications on file prior to visit.   No family history on file.   Social History   Tobacco Use  Smoking Status Not on file  Smokeless Tobacco Not on file    Social History   Socioeconomic History  Marital status: Single   Objective:   Vitals:  11/14/21 1351  BP: 120/70  Pulse: 92  SpO2: 96%  Weight: 82.1 kg (181 lb)  Height: 185.4 cm ('6\' 1"'$ )   Body mass index is 23.88 kg/m.  Physical Exam General: Thin African-American man who has some issues with mobility because of the lumbosacral surgery and bilateral foot drops HEENT : The mass on the left cheek just outside of the lips on the left Chest: Clear to auscultation Heart: Sinus rhythm without murmurs or gallops Breast: Not examined Abdomen: Thin with upper midline incision GU right inguinal hernia bulging and obvious. No left inguinal hernia Rectal not performed Extremities full range of motion Neuro alert and oriented x3. Motor and sensory function grossly intact  Labs, Imaging and Diagnostic Testing: No labs to review  Assessment and Plan:   Right inguinal hernia  Epidermal cyst of face    We will go ahead and schedule for open right inguinal hernia repair with mesh and also excision of the left cheek nodule which is likely epidermal cyst.  Return for postop follow up.  Byrd Rushlow Donia Pounds, MD

## 2021-11-21 NOTE — Progress Notes (Signed)
I will review the packet when available. Please ensure that we have color photos as well as for colonoscopy report and pathology so that I can make a determination as to potential consultation/referral being accepted. If these are not available, please try to get them ASAP. Thanks. GM

## 2021-11-23 ENCOUNTER — Ambulatory Visit (HOSPITAL_COMMUNITY): Payer: Commercial Managed Care - HMO | Admitting: Anesthesiology

## 2021-11-23 ENCOUNTER — Ambulatory Visit (HOSPITAL_COMMUNITY)
Admission: RE | Admit: 2021-11-23 | Discharge: 2021-11-23 | Disposition: A | Discharge status: Home / Self Care | Payer: Commercial Managed Care - HMO | Source: Ambulatory Visit | Attending: Surgery | Admitting: Surgery

## 2021-11-23 ENCOUNTER — Encounter (HOSPITAL_COMMUNITY): Payer: Self-pay | Admitting: Surgery

## 2021-11-23 ENCOUNTER — Other Ambulatory Visit: Payer: Self-pay

## 2021-11-23 ENCOUNTER — Ambulatory Visit (HOSPITAL_BASED_OUTPATIENT_CLINIC_OR_DEPARTMENT_OTHER): Payer: Commercial Managed Care - HMO | Admitting: Anesthesiology

## 2021-11-23 ENCOUNTER — Telehealth: Payer: Self-pay | Admitting: Family Medicine

## 2021-11-23 ENCOUNTER — Encounter (HOSPITAL_COMMUNITY)
Admission: RE | Disposition: A | Discharge status: Home / Self Care | Payer: Self-pay | Source: Ambulatory Visit | Attending: Surgery

## 2021-11-23 DIAGNOSIS — K4090 Unilateral inguinal hernia, without obstruction or gangrene, not specified as recurrent: Secondary | ICD-10-CM | POA: Diagnosis not present

## 2021-11-23 DIAGNOSIS — Z87891 Personal history of nicotine dependence: Secondary | ICD-10-CM | POA: Diagnosis not present

## 2021-11-23 DIAGNOSIS — D176 Benign lipomatous neoplasm of spermatic cord: Secondary | ICD-10-CM | POA: Insufficient documentation

## 2021-11-23 DIAGNOSIS — L720 Epidermal cyst: Secondary | ICD-10-CM | POA: Diagnosis not present

## 2021-11-23 DIAGNOSIS — F419 Anxiety disorder, unspecified: Secondary | ICD-10-CM | POA: Diagnosis not present

## 2021-11-23 DIAGNOSIS — F339 Major depressive disorder, recurrent, unspecified: Secondary | ICD-10-CM

## 2021-11-23 DIAGNOSIS — F32A Depression, unspecified: Secondary | ICD-10-CM | POA: Insufficient documentation

## 2021-11-23 DIAGNOSIS — G629 Polyneuropathy, unspecified: Secondary | ICD-10-CM

## 2021-11-23 HISTORY — PX: MASS EXCISION: SHX2000

## 2021-11-23 HISTORY — PX: INGUINAL HERNIA REPAIR: SHX194

## 2021-11-23 SURGERY — REPAIR, HERNIA, INGUINAL, ADULT
Anesthesia: General | Laterality: Right

## 2021-11-23 MED ORDER — DEXAMETHASONE SODIUM PHOSPHATE 10 MG/ML IJ SOLN
INTRAMUSCULAR | Status: DC | PRN
  Administered 2021-11-23: 5 mg via INTRAVENOUS

## 2021-11-23 MED ORDER — PROPOFOL 10 MG/ML IV BOLUS
INTRAVENOUS | Status: AC
  Filled 2021-11-23: qty 20

## 2021-11-23 MED ORDER — PROMETHAZINE HCL 25 MG/ML IJ SOLN: 6.2500 mg | INTRAMUSCULAR | Status: DC | PRN

## 2021-11-23 MED ORDER — CEFAZOLIN SODIUM-DEXTROSE 2-4 GM/100ML-% IV SOLN
2.0000 g | INTRAVENOUS | Status: AC
  Administered 2021-11-23: 2 g via INTRAVENOUS
  Filled 2021-11-23: qty 100

## 2021-11-23 MED ORDER — ONDANSETRON HCL 4 MG/2ML IJ SOLN
INTRAMUSCULAR | Status: AC
Start: 2021-11-23 — End: ?
  Filled 2021-11-23: qty 2

## 2021-11-23 MED ORDER — HYDROMORPHONE HCL 1 MG/ML IJ SOLN
0.5000 mg | Freq: Once | INTRAMUSCULAR | Status: AC
  Administered 2021-11-23: 0.5 mg via INTRAVENOUS

## 2021-11-23 MED ORDER — CHLORHEXIDINE GLUCONATE CLOTH 2 % EX PADS: 6.0000 | MEDICATED_PAD | Freq: Once | CUTANEOUS | Status: DC

## 2021-11-23 MED ORDER — PHENYLEPHRINE HCL (PRESSORS) 10 MG/ML IV SOLN
INTRAVENOUS | Status: AC
  Filled 2021-11-23: qty 1

## 2021-11-23 MED ORDER — FENTANYL CITRATE PF 50 MCG/ML IJ SOSY
25.0000 ug | PREFILLED_SYRINGE | INTRAMUSCULAR | Status: DC | PRN
  Administered 2021-11-23 (×3): 50 ug via INTRAVENOUS

## 2021-11-23 MED ORDER — HYDROMORPHONE HCL 1 MG/ML IJ SOLN
INTRAMUSCULAR | Status: AC
  Filled 2021-11-23: qty 1

## 2021-11-23 MED ORDER — DEXAMETHASONE SODIUM PHOSPHATE 10 MG/ML IJ SOLN
INTRAMUSCULAR | Status: AC
  Filled 2021-11-23: qty 1

## 2021-11-23 MED ORDER — PROPOFOL 10 MG/ML IV BOLUS
INTRAVENOUS | Status: DC | PRN
  Administered 2021-11-23: 140 mg via INTRAVENOUS

## 2021-11-23 MED ORDER — MIDAZOLAM HCL 5 MG/5ML IJ SOLN
INTRAMUSCULAR | Status: DC | PRN
  Administered 2021-11-23: 2 mg via INTRAVENOUS

## 2021-11-23 MED ORDER — OXYCODONE HCL 5 MG PO TABS
5.0000 mg | ORAL_TABLET | Freq: Once | ORAL | Status: AC
  Administered 2021-11-23: 5 mg via ORAL

## 2021-11-23 MED ORDER — OXYCODONE HCL 5 MG PO TABS: 5.0000 mg | ORAL_TABLET | Freq: Four times a day (QID) | ORAL | 0 refills | Status: DC | PRN

## 2021-11-23 MED ORDER — LACTATED RINGERS IV SOLN: INTRAVENOUS | Status: DC

## 2021-11-23 MED ORDER — ORAL CARE MOUTH RINSE: 15.0000 mL | Freq: Once | OROMUCOSAL | Status: AC

## 2021-11-23 MED ORDER — SUGAMMADEX SODIUM 200 MG/2ML IV SOLN
INTRAVENOUS | Status: DC | PRN
  Administered 2021-11-23: 200 mg via INTRAVENOUS

## 2021-11-23 MED ORDER — FENTANYL CITRATE (PF) 250 MCG/5ML IJ SOLN
INTRAMUSCULAR | Status: DC | PRN
  Administered 2021-11-23 (×3): 50 ug via INTRAVENOUS
  Administered 2021-11-23: 100 ug via INTRAVENOUS

## 2021-11-23 MED ORDER — MIDAZOLAM HCL 2 MG/2ML IJ SOLN
INTRAMUSCULAR | Status: AC
Start: 2021-11-23 — End: ?
  Filled 2021-11-23: qty 2

## 2021-11-23 MED ORDER — 0.9 % SODIUM CHLORIDE (POUR BTL) OPTIME
TOPICAL | Status: DC | PRN
  Administered 2021-11-23: 1000 mL

## 2021-11-23 MED ORDER — PHENYLEPHRINE 80 MCG/ML (10ML) SYRINGE FOR IV PUSH (FOR BLOOD PRESSURE SUPPORT)
PREFILLED_SYRINGE | INTRAVENOUS | Status: DC | PRN
  Administered 2021-11-23: 80 ug via INTRAVENOUS

## 2021-11-23 MED ORDER — CHLORHEXIDINE GLUCONATE 0.12 % MT SOLN
15.0000 mL | Freq: Once | OROMUCOSAL | Status: AC
  Administered 2021-11-23: 15 mL via OROMUCOSAL

## 2021-11-23 MED ORDER — BUPIVACAINE LIPOSOME 1.3 % IJ SUSP
INTRAMUSCULAR | Status: AC
  Filled 2021-11-23: qty 20

## 2021-11-23 MED ORDER — ROCURONIUM BROMIDE 10 MG/ML (PF) SYRINGE
PREFILLED_SYRINGE | INTRAVENOUS | Status: DC | PRN
  Administered 2021-11-23: 60 mg via INTRAVENOUS
  Administered 2021-11-23: 20 mg via INTRAVENOUS

## 2021-11-23 MED ORDER — OXYCODONE HCL 5 MG PO TABS
ORAL_TABLET | ORAL | Status: AC
  Filled 2021-11-23: qty 1

## 2021-11-23 MED ORDER — ONDANSETRON HCL 4 MG/2ML IJ SOLN
INTRAMUSCULAR | Status: DC | PRN
  Administered 2021-11-23: 4 mg via INTRAVENOUS

## 2021-11-23 MED ORDER — LIDOCAINE 2% (20 MG/ML) 5 ML SYRINGE
INTRAMUSCULAR | Status: DC | PRN
  Administered 2021-11-23: 80 mg via INTRAVENOUS
  Administered 2021-11-23: 1.5 mg/kg/h via INTRAVENOUS

## 2021-11-23 MED ORDER — SUCCINYLCHOLINE CHLORIDE 200 MG/10ML IV SOSY
PREFILLED_SYRINGE | INTRAVENOUS | Status: AC
Start: 2021-11-23 — End: ?
  Filled 2021-11-23: qty 10

## 2021-11-23 MED ORDER — FENTANYL CITRATE (PF) 250 MCG/5ML IJ SOLN
INTRAMUSCULAR | Status: AC
  Filled 2021-11-23: qty 5

## 2021-11-23 MED ORDER — ROCURONIUM BROMIDE 10 MG/ML (PF) SYRINGE
PREFILLED_SYRINGE | INTRAVENOUS | Status: AC
Start: 2021-11-23 — End: ?
  Filled 2021-11-23: qty 10

## 2021-11-23 MED ORDER — FENTANYL CITRATE PF 50 MCG/ML IJ SOSY
PREFILLED_SYRINGE | INTRAMUSCULAR | Status: AC
  Filled 2021-11-23: qty 3

## 2021-11-23 MED ORDER — ACETAMINOPHEN 500 MG PO TABS
1000.0000 mg | ORAL_TABLET | Freq: Once | ORAL | Status: AC
  Administered 2021-11-23: 1000 mg via ORAL
  Filled 2021-11-23: qty 2

## 2021-11-23 MED ORDER — DEXMEDETOMIDINE (PRECEDEX) IN NS 20 MCG/5ML (4 MCG/ML) IV SYRINGE
PREFILLED_SYRINGE | INTRAVENOUS | Status: DC | PRN
  Administered 2021-11-23 (×2): 10 ug via INTRAVENOUS

## 2021-11-23 MED ORDER — BUPIVACAINE LIPOSOME 1.3 % IJ SUSP
INTRAMUSCULAR | Status: DC | PRN
  Administered 2021-11-23: 10 mL

## 2021-11-23 SURGICAL SUPPLY — 45 items
BAG COUNTER SPONGE SURGICOUNT (BAG) IMPLANT
BENZOIN TINCTURE PRP APPL 2/3 (GAUZE/BANDAGES/DRESSINGS) IMPLANT
BLADE SURG 15 STRL LF DISP TIS (BLADE) ×2 IMPLANT
BLADE SURG 15 STRL SS (BLADE) ×2
COVER SURGICAL LIGHT HANDLE (MISCELLANEOUS) ×2 IMPLANT
DERMABOND ADVANCED (GAUZE/BANDAGES/DRESSINGS) ×4
DERMABOND ADVANCED .7 DNX12 (GAUZE/BANDAGES/DRESSINGS) IMPLANT
DISSECTOR ROUND CHERRY 3/8 STR (MISCELLANEOUS) IMPLANT
DRAIN PENROSE 0.5X18 (DRAIN) ×2 IMPLANT
DRAPE LAPAROSCOPIC ABDOMINAL (DRAPES) IMPLANT
DRAPE LAPAROTOMY T 102X78X121 (DRAPES) IMPLANT
DRAPE LAPAROTOMY TRNSV 102X78 (DRAPES) ×2 IMPLANT
DRAPE UTILITY XL STRL (DRAPES) ×2 IMPLANT
ELECT REM PT RETURN 15FT ADLT (MISCELLANEOUS) ×2 IMPLANT
GAUZE PACKING IODOFORM 1/4X15 (PACKING) IMPLANT
GAUZE SPONGE 4X4 12PLY STRL (GAUZE/BANDAGES/DRESSINGS) ×2 IMPLANT
GLOVE SURG LX 8.0 MICRO (GLOVE) ×2
GLOVE SURG LX STRL 8.0 MICRO (GLOVE) ×2 IMPLANT
GOWN STRL REUS W/ TWL XL LVL3 (GOWN DISPOSABLE) ×4 IMPLANT
GOWN STRL REUS W/TWL XL LVL3 (GOWN DISPOSABLE) ×4
KIT BASIN OR (CUSTOM PROCEDURE TRAY) ×2 IMPLANT
KIT TURNOVER KIT A (KITS) IMPLANT
MARKER SKIN DUAL TIP RULER LAB (MISCELLANEOUS) ×2 IMPLANT
MESH HERNIA 3X6 (Mesh General) IMPLANT
NEEDLE HYPO 22GX1.5 SAFETY (NEEDLE) ×2 IMPLANT
PACK BASIC VI WITH GOWN DISP (CUSTOM PROCEDURE TRAY) ×2 IMPLANT
PACK GENERAL/GYN (CUSTOM PROCEDURE TRAY) ×2 IMPLANT
PAD TELFA 2X3 NADH STRL (GAUZE/BANDAGES/DRESSINGS) IMPLANT
PENCIL SMOKE EVACUATOR (MISCELLANEOUS) IMPLANT
SPIKE FLUID TRANSFER (MISCELLANEOUS) ×2 IMPLANT
SPONGE T-LAP 4X18 ~~LOC~~+RFID (SPONGE) ×2 IMPLANT
STAPLER VISISTAT 35W (STAPLE) IMPLANT
SUT MNCRL AB 4-0 PS2 18 (SUTURE) IMPLANT
SUT PROLENE 2 0 CT2 30 (SUTURE) ×4 IMPLANT
SUT SILK 2 0 SH (SUTURE) IMPLANT
SUT VIC AB 2-0 SH 27 (SUTURE) ×2
SUT VIC AB 2-0 SH 27X BRD (SUTURE) ×2 IMPLANT
SUT VIC AB 3-0 SH 27 (SUTURE)
SUT VIC AB 3-0 SH 27XBRD (SUTURE) IMPLANT
SUT VIC AB 4-0 SH 18 (SUTURE) ×2 IMPLANT
SUT VICRYL 4-0 (SUTURE) ×2 IMPLANT
SYR 20ML LL LF (SYRINGE) ×2 IMPLANT
SYR BULB IRRIG 60ML STRL (SYRINGE) ×2 IMPLANT
TOWEL OR 17X26 10 PK STRL BLUE (TOWEL DISPOSABLE) ×2 IMPLANT
TOWEL OR NON WOVEN STRL DISP B (DISPOSABLE) ×2 IMPLANT

## 2021-11-23 NOTE — Anesthesia Preprocedure Evaluation (Addendum)
Anesthesia Evaluation  Patient identified by MRN, date of birth, ID band Patient awake    Reviewed: Allergy & Precautions, NPO status , Patient's Chart, lab work & pertinent test results  Airway Mallampati: III  TM Distance: >3 FB Neck ROM: Full    Dental  (+) Teeth Intact, Dental Advisory Given   Pulmonary former smoker,    Pulmonary exam normal breath sounds clear to auscultation       Cardiovascular negative cardio ROS Normal cardiovascular exam Rhythm:Regular Rate:Normal     Neuro/Psych PSYCHIATRIC DISORDERS Anxiety Depression  Neuromuscular disease    GI/Hepatic Neg liver ROS, GERD  ,RIGHT INGUINAL HERNIA   Endo/Other  negative endocrine ROS  Renal/GU negative Renal ROS     Musculoskeletal  (+) Arthritis , Bilateral foot drop   Abdominal   Peds  Hematology negative hematology ROS (+)   Anesthesia Other Findings Day of surgery medications reviewed with the patient.  Reproductive/Obstetrics                           Anesthesia Physical Anesthesia Plan  ASA: 2  Anesthesia Plan: General   Post-op Pain Management: Tylenol PO (pre-op)* and Toradol IV (intra-op)*   Induction: Intravenous  PONV Risk Score and Plan: 3 and Midazolam, Dexamethasone and Ondansetron  Airway Management Planned: Oral ETT  Additional Equipment:   Intra-op Plan:   Post-operative Plan: Extubation in OR  Informed Consent: I have reviewed the patients History and Physical, chart, labs and discussed the procedure including the risks, benefits and alternatives for the proposed anesthesia with the patient or authorized representative who has indicated his/her understanding and acceptance.     Dental advisory given  Plan Discussed with: CRNA  Anesthesia Plan Comments:         Anesthesia Quick Evaluation

## 2021-11-23 NOTE — Anesthesia Procedure Notes (Signed)
Procedure Name: Intubation Date/Time: 11/23/2021 2:19 PM  Performed by: Raenette Rover, CRNAPre-anesthesia Checklist: Patient identified, Emergency Drugs available, Suction available and Patient being monitored Patient Re-evaluated:Patient Re-evaluated prior to induction Oxygen Delivery Method: Circle system utilized Preoxygenation: Pre-oxygenation with 100% oxygen Induction Type: IV induction Ventilation: Mask ventilation without difficulty Laryngoscope Size: Mac and 4 Grade View: Grade I Tube type: Oral Tube size: 7.5 mm Number of attempts: 1 Airway Equipment and Method: Stylet Placement Confirmation: ETT inserted through vocal cords under direct vision, positive ETCO2 and breath sounds checked- equal and bilateral Secured at: 21 cm Tube secured with: Tape Dental Injury: Teeth and Oropharynx as per pre-operative assessment

## 2021-11-23 NOTE — Discharge Instructions (Signed)
Wear support underwear You may shower tomorrow

## 2021-11-23 NOTE — Telephone Encounter (Signed)
Caller Name: [facility] Call [facility] phone #: [phone]  MEDICATION(S):  buPROPion (WELLBUTRIN XL) 300 MG 24 hr tablet gabapentin (NEURONTIN) 300 MG capsule  Days of Med Remaining: pt insurance plan is requiring maintenance medications to be filled for 90 day supply from mail order pharmacy  Has the patient contacted their pharmacy (YES/NO)? Yes/they have sent request via fax 2x What did pharmacy advise?   Preferred Pharmacy:  [facility]

## 2021-11-23 NOTE — Transfer of Care (Signed)
Immediate Anesthesia Transfer of Care Note  Patient: Thomas Rocha  Procedure(s) Performed: OPEN RIGHT INGUINAL HERNIA REPAIR (Right) EXCISION OF LEFT CHEEK MASS (Left)  Patient Location: PACU  Anesthesia Type:General  Level of Consciousness: drowsy and patient cooperative  Airway & Oxygen Therapy: Patient Spontanous Breathing and Patient connected to face mask oxygen  Post-op Assessment: Report given to RN and Post -op Vital signs reviewed and stable  Post vital signs: Reviewed and stable  Last Vitals:  Vitals Value Taken Time  BP 153/94 11/23/21 1622  Temp    Pulse 59 11/23/21 1623  Resp 16 11/23/21 1624  SpO2 100 % 11/23/21 1623  Vitals shown include unvalidated device data.  Last Pain:  Vitals:   11/23/21 1053  TempSrc:   PainSc: 0-No pain         Complications: No notable events documented.

## 2021-11-23 NOTE — Interval H&P Note (Signed)
History and Physical Interval Note:  11/23/2021 12:08 PM  Thomas Rocha  has presented today for surgery, with the diagnosis of RIGHT INGUINAL HERNIA, EPIDERMAL CYST OF LEFT CHEEK.  The various methods of treatment have been discussed with the patient and family. After consideration of risks, benefits and other options for treatment, the patient has consented to  Procedure(s): OPEN RIGHT INGUINAL HERNIA REPAIR (Right) EXCISION OF LEFT CHEEK MASS (Left) as a surgical intervention.  The patient's history has been reviewed, patient examined, no change in status, stable for surgery.  I have reviewed the patient's chart and labs.  Questions were answered to the patient's satisfaction.     Pedro Earls

## 2021-11-23 NOTE — Op Note (Signed)
Romain Erion  [birth year]   11/23/2021    PCP:  Libby Maw, MD   Surgeon: Kaylyn Lim, MD, FACS  Asst:  Toula Moos, MD  Anes:  general  Preop Dx: Right inguinal hernia; mass on left cheek near mouth Postop Dx: Right indirect inguinal hernia; epidermoid cyst on cheek  Procedure: Open right inguinal hernia with mesh;  excision of cheek epidermoid cyst Location Surgery: [facility] 5 Complications:  None noted  EBL:   minimal cc  Drains: none  Description of Procedure:  The patient was taken to OR 5 .  After anesthesia was administered and the patient was prepped  with Betadine on the scrotum and ChloraPrep in the inguinal region.  Timeout performed for the marked side.  An oblique was incision incision was made and carried down through the very thin subcutaneous tissue to the external oblique fibers which were identified and incised.  External ring was open and the cord was mobilized Penrose drain placed about it.  The indirect hernia was noted and it was mobilized from the cord structures.  The vas deferens and vessels were spared.  Lipoma the cord was also duct dissected free and removed.  Sac was opened and with digital exploration was separated from the cord structures and then a high ligation was performed with a 2-0 silk.  A piece of Marlex type mesh was cut to fit and sutured along the inguinal ligament.  This was done after I put in a figure-of-eight suture medially to snug up the ring through the transversalis muscle and fascia.  The cord was similarly sewn medially along the internal oblique muscles and the 2 limbs were brought around the internal to create a neonate Neo internal ring and sutured to themselves with a horizontal mattress suture.  They were tucked beneath the external oblique which was then closed with a running 2-0 Vicryl.  A tap block of been done with Exparel around the anterior superior iliac spine.  The wound was then closed with 4-0 Vicryl in the  subcutaneous tissue and then with a running subcuticular 4-0 Monocryl and Dermabond.    Next the mass on the left cheek was palpated and had been previously marked.  It was prepped with Betadine and a transverse incision was made which was perpendicular to the longer's lines and carried down directly onto this.  The resultant dissection allowed the same to decompress and it was a sebaceous cyst.  A sac was then removed.  The wound was then closed with a single Monocryl and Dermabond.    The patient tolerated the procedure well and was taken to the PACU in stable condition.    I was personally present during all  of this procedure and immediately available throughout the entire procedure, as documented in my operative note.   Matt B. Hassell Done, [place], [facility], [place]

## 2021-11-25 NOTE — Anesthesia Postprocedure Evaluation (Signed)
Anesthesia Post Note  Patient: Thomas Rocha  Procedure(s) Performed: OPEN RIGHT INGUINAL HERNIA REPAIR (Right) EXCISION OF LEFT CHEEK MASS (Left)     Patient location during evaluation: PACU Anesthesia Type: General Level of consciousness: sedated and patient cooperative Pain management: pain level controlled Vital Signs Assessment: post-procedure vital signs reviewed and stable Respiratory status: spontaneous breathing Cardiovascular status: stable Anesthetic complications: no   No notable events documented.  Last Vitals:  Vitals:   11/23/21 1712 11/23/21 1715  BP:  135/89  Pulse: (!) 54 63  Resp: 20 16  Temp:    SpO2: 99% 100%    Last Pain:  Vitals:   11/23/21 1715  TempSrc:   PainSc: [name]

## 2021-11-27 ENCOUNTER — Encounter (HOSPITAL_COMMUNITY): Payer: Self-pay | Admitting: Surgery

## 2021-11-28 ENCOUNTER — Other Ambulatory Visit: Payer: Self-pay

## 2021-11-28 ENCOUNTER — Other Ambulatory Visit: Payer: Commercial Managed Care - HMO

## 2021-11-28 DIAGNOSIS — D3A026 Benign carcinoid tumor of the rectum: Secondary | ICD-10-CM

## 2021-11-28 MED ORDER — BUPROPION HCL ER (XL) 300 MG PO TB24: 300.0000 mg | ORAL_TABLET | Freq: Every day | ORAL | 1 refills | Status: DC

## 2021-11-28 MED ORDER — GABAPENTIN 300 MG PO CAPS: 300.0000 mg | ORAL_CAPSULE | Freq: Two times a day (BID) | ORAL | 0 refills | Status: DC

## 2021-11-29 LAB — CHROMOGRANIN A: Chromogranin A (ng/mL): 96.7 ng/mL (ref 0.0–101.8)

## 2021-12-05 ENCOUNTER — Other Ambulatory Visit: Payer: Self-pay

## 2021-12-05 NOTE — Progress Notes (Signed)
The proposed treatment discussed in conference is for discussion purpose only and is not a binding recommendation.  The patients have not been physically examined, or presented with their treatment options.  Therefore, final treatment plans cannot be decided.  

## 2021-12-12 ENCOUNTER — Telehealth: Payer: Self-pay | Admitting: Family Medicine

## 2021-12-12 ENCOUNTER — Other Ambulatory Visit: Payer: Self-pay | Admitting: Family Medicine

## 2021-12-12 DIAGNOSIS — N5201 Erectile dysfunction due to arterial insufficiency: Secondary | ICD-10-CM

## 2021-12-12 MED ORDER — SILDENAFIL CITRATE 20 MG PO TABS: ORAL_TABLET | ORAL | 0 refills | Status: DC

## 2021-12-12 NOTE — Telephone Encounter (Signed)
Caller Name: pt Call back phone #: [phone]   MEDICATION(S):  sildenafil (REVATIO) 20 MG tablet [[id number]]   Days of Med Remaining: he is out and he is traveling out of town tomorrow 12/12/21  Has the patient contacted their pharmacy (YES/NO)? Yes contact your PCP  Preferred Pharmacy:  [facility], [address]  [place], [place] [address]  Phone:  [phone]  Fax:  [phone]

## 2021-12-14 ENCOUNTER — Encounter: Payer: Self-pay | Admitting: Gastroenterology

## 2021-12-14 NOTE — Progress Notes (Signed)
Patient's case was discussed at [facility] recently. Oncology and surgical oncology and GI and radiology and pathology were available for discussion.  No evidence of metastatic disease present on cross-sectional imaging.  However the prostate did look to be a little bit fuller.  It was recommended that if prostate cancer screening had not been initiated for it to be considered. From a follow-up standpoint of the rectosigmoid carcinoid/neuroendocrine tumor it was felt that endoscopic surveillance locally would be the best for him at this time. We will plan a follow-up 1 year EUS to reevaluate the site.  We will then decide for how long we will pursue this for up to 5 years on a yearly to every 2-year basis.  I called and spoke with the patient about this plan of action and he agrees and we will move forward with her lower EUS at 1 year mark.  He does follow with a PCP and has had his prostate evaluated previously with his latch PSA in 2022.  I will forward this information to the patient's PCP and the patient that PCP can decide whether additional PSA evaluation or any further evaluation may be required.    Justice Britain, MD [facility] Office # [phone]

## 2021-12-14 NOTE — Progress Notes (Signed)
1 year recall for lower EUS already in epic (10/2022).

## 2022-01-15 ENCOUNTER — Telehealth: Payer: Self-pay | Admitting: Family Medicine

## 2022-01-15 NOTE — Telephone Encounter (Signed)
Thomas Rocha is calling from [employer]. Pt is wanting to get Slidenafil Citrate '20mg'$ s. This needs to be requested through Cover [employer].

## 2022-01-24 NOTE — Telephone Encounter (Signed)
Patient have picked up Rx

## 2022-02-11 ENCOUNTER — Telehealth: Payer: Self-pay | Admitting: Family Medicine

## 2022-02-11 DIAGNOSIS — G8929 Other chronic pain: Secondary | ICD-10-CM

## 2022-02-11 NOTE — Telephone Encounter (Signed)
Please advise message below, okay for referral? Last OV 10/23/21

## 2022-02-11 NOTE — Telephone Encounter (Signed)
Pt is needing a referral to a neurologist Dr. Myrle Sheng, MD Address: [address], [place], [place] [address] Phone: [phone] for back pain. He says this issue has been discussed with Dr. Ethelene Hal in the past. Please advise pt @ [phone]

## 2022-02-11 NOTE — Telephone Encounter (Signed)
Referral placed patient aware

## 2022-02-11 NOTE — Addendum Note (Signed)
Addended by: Lynda Rainwater on: 02/11/2022 12:25 PM   Modules accepted: Orders

## 2022-02-22 DIAGNOSIS — Z419 Encounter for procedure for purposes other than remedying health state, unspecified: Secondary | ICD-10-CM | POA: Diagnosis not present

## 2022-02-24 ENCOUNTER — Other Ambulatory Visit: Payer: Self-pay | Admitting: Family Medicine

## 2022-02-24 DIAGNOSIS — N5201 Erectile dysfunction due to arterial insufficiency: Secondary | ICD-10-CM

## 2022-02-25 ENCOUNTER — Telehealth: Payer: Self-pay

## 2022-02-25 ENCOUNTER — Other Ambulatory Visit (HOSPITAL_COMMUNITY): Payer: Self-pay

## 2022-02-25 NOTE — Telephone Encounter (Unsigned)
Pharmacy Patient Advocate Encounter  Received notification from [facility] that the request for prior authorization for Sildenafil Citrate has been denied due to     Patient Advocate Encounter   Received notification from [facility] that prior authorization for Sildenafil Citrate '20mg'$  is required.   PA submitted on 02/25/2022 Key [id number] Status is pending       Joneen Boers, [place] Patient Advocate Specialist [facility] Patient Advocate Team Direct Number: [phone] Fax: [phone]

## 2022-02-27 NOTE — Telephone Encounter (Signed)
Duplicate

## 2022-03-19 NOTE — Telephone Encounter (Signed)
Patient aware of denial.

## 2022-03-20 ENCOUNTER — Other Ambulatory Visit: Payer: Self-pay | Admitting: Family Medicine

## 2022-03-20 DIAGNOSIS — N5201 Erectile dysfunction due to arterial insufficiency: Secondary | ICD-10-CM

## 2022-03-25 DIAGNOSIS — Z419 Encounter for procedure for purposes other than remedying health state, unspecified: Secondary | ICD-10-CM | POA: Diagnosis not present

## 2022-04-11 ENCOUNTER — Other Ambulatory Visit: Payer: Self-pay | Admitting: Family Medicine

## 2022-04-11 DIAGNOSIS — N5201 Erectile dysfunction due to arterial insufficiency: Secondary | ICD-10-CM

## 2022-04-13 ENCOUNTER — Other Ambulatory Visit: Payer: Self-pay | Admitting: Family Medicine

## 2022-04-13 DIAGNOSIS — N5201 Erectile dysfunction due to arterial insufficiency: Secondary | ICD-10-CM

## 2022-04-16 ENCOUNTER — Encounter: Payer: Self-pay | Admitting: Family Medicine

## 2022-04-16 ENCOUNTER — Telehealth: Payer: Self-pay | Admitting: Family Medicine

## 2022-04-16 ENCOUNTER — Ambulatory Visit (INDEPENDENT_AMBULATORY_CARE_PROVIDER_SITE_OTHER): Payer: Medicaid Other | Admitting: Family Medicine

## 2022-04-16 VITALS — BP 138/84 | HR 56 | Temp 97.8°F | Ht 73.0 in | Wt 187.8 lb

## 2022-04-16 DIAGNOSIS — B351 Tinea unguium: Secondary | ICD-10-CM | POA: Diagnosis not present

## 2022-04-16 DIAGNOSIS — Z202 Contact with and (suspected) exposure to infections with a predominantly sexual mode of transmission: Secondary | ICD-10-CM

## 2022-04-16 DIAGNOSIS — R350 Frequency of micturition: Secondary | ICD-10-CM | POA: Diagnosis not present

## 2022-04-16 DIAGNOSIS — M21371 Foot drop, right foot: Secondary | ICD-10-CM

## 2022-04-16 DIAGNOSIS — N401 Enlarged prostate with lower urinary tract symptoms: Secondary | ICD-10-CM | POA: Diagnosis not present

## 2022-04-16 MED ORDER — TAMSULOSIN HCL 0.4 MG PO CAPS: 0.4000 mg | ORAL_CAPSULE | Freq: Every day | ORAL | 2 refills | Status: DC

## 2022-04-16 NOTE — Progress Notes (Signed)
Established Patient Office Visit   Subjective:  Patient ID: Thomas Rocha, male    DOB: 10-22-1970  Age: 52 y.o. MRN: [id number]  Chief Complaint  Patient presents with   Exposure to STD    Patient would like STD testing partner having symptoms.    Exposure to STD Pertinent negatives include no abdominal pain, dysuria, frequency, rash or urgency.   Encounter Diagnoses  Name Primary?   Onychomycosis of great toe Yes   STD exposure    Benign prostatic hyperplasia with urinary frequency    Foot drop, right    For follow-up of above.  He completed 3 months of therapy with Lamisil.  Here for STD testing.  He is concerned about the health of the significant other.  He has no symptoms of an STD.  He denies changes in his urination including dysuria, discharge or rash.  Flomax has improved his urine flow a great deal.  Nocturia is greatly reduced while taking it.  There has been improvement in the force of his stream as well.  Continues Lyrica 75 3 times daily for chronic back pain.  Continues follow-up with neurosurgery.   Review of Systems  Constitutional: Negative.   HENT: Negative.    Eyes:  Negative for blurred vision, discharge and redness.  Respiratory: Negative.    Cardiovascular: Negative.   Gastrointestinal:  Negative for abdominal pain.  Genitourinary: Negative.  Negative for dysuria, frequency, hematuria and urgency.  Musculoskeletal: Negative.  Negative for myalgias.  Skin:  Negative for rash.  Neurological:  Negative for tingling, loss of consciousness and weakness.  Endo/Heme/Allergies:  Negative for polydipsia.     Current Outpatient Medications:    AMBULATORY NON FORMULARY MEDICATION, Ankle Foot Orthosis for right leg foot drop.  Disp 1. M21.371, Disp: 1 each, Rfl: 0   buPROPion (WELLBUTRIN XL) 300 MG 24 hr tablet, Take 1 tablet (300 mg total) by mouth daily., Disp: 90 tablet, Rfl: 1   ibuprofen (ADVIL) 200 MG tablet, Take 200-400 mg by mouth every 8 (eight)  hours as needed (pain.)., Disp: , Rfl:    Misc. Devices MISC, AFO for left foot, Disp: , Rfl:    oxyCODONE (OXY IR/ROXICODONE) 5 MG immediate release tablet, Take 1 tablet (5 mg total) by mouth every 6 (six) hours as needed for severe pain., Disp: 15 tablet, Rfl: 0   pregabalin (LYRICA) 75 MG capsule, Take 75 mg by mouth 3 (three) times daily., Disp: , Rfl:    sildenafil (REVATIO) 20 MG tablet, TAKE 3 TO 5 TABLETS BY MOUTH ONCE DAILY 45 MINUTES PRIOR TO INTERCOURSE, NO MORE THAN 5 TABLETS DAILY, Disp: 50 tablet, Rfl: 0   terbinafine (LAMISIL) 250 MG tablet, Take 1 tablet (250 mg total) by mouth daily., Disp: 90 tablet, Rfl: 0   tamsulosin (FLOMAX) 0.4 MG CAPS capsule, Take 1 capsule (0.4 mg total) by mouth at bedtime., Disp: 90 capsule, Rfl: 2   Objective:     BP 138/84 (BP Location: Left Arm, Patient Position: Sitting, Cuff Size: Normal)   Pulse (!) 56   Temp 97.8 F (36.6 C) (Temporal)   Ht '6\' 1"'$  (1.854 m)   Wt 187 lb 12.8 oz (85.2 kg)   SpO2 96%   BMI 24.78 kg/m    Physical Exam Constitutional:      General: He is not in acute distress.    Appearance: Normal appearance. He is not ill-appearing, toxic-appearing or diaphoretic.  HENT:     Head: Normocephalic and atraumatic.  Right Ear: External ear normal.     Left Ear: External ear normal.  Eyes:     General: No scleral icterus.       Right eye: No discharge.        Left eye: No discharge.     Extraocular Movements: Extraocular movements intact.     Conjunctiva/sclera: Conjunctivae normal.  Pulmonary:     Effort: Pulmonary effort is normal. No respiratory distress.  Skin:    General: Skin is warm and dry.     Comments: There is normal appearing nail coming in at the base of both great toes.  Neurological:     Mental Status: He is alert and oriented to person, place, and time.  Psychiatric:        Mood and Affect: Mood normal.        Behavior: Behavior normal.      No results found for any visits on  04/16/22.    The 10-year ASCVD risk score (Arnett DK, et al., 2019) is: 6.7%    Assessment & Plan:   Onychomycosis of great toe  STD exposure -     Urine cytology ancillary only -     Urinalysis, Routine w reflex microscopic -     HIV Antibody (routine testing w rflx) -     RPR  Benign prostatic hyperplasia with urinary frequency -     Tamsulosin HCl; Take 1 capsule (0.4 mg total) by mouth at bedtime.  Dispense: 90 capsule; Refill: 2 -     PSA  Foot drop, right    Return Please schedule and return fasting for physical exam..    Libby Maw, MD

## 2022-04-16 NOTE — Telephone Encounter (Signed)
Pt was seen today 04/16/22 and got the script tamsulosin (FLOMAX) 0.4 MG CAPS capsule [[id number]]. His pharmacy is saying he needs a PA for this medication, they need a reason why he should be taking it.  [facility], [place] [address], [place] [address] Phone: [phone]  Fax: [phone]   Pt at [phone]

## 2022-04-17 LAB — URINALYSIS, ROUTINE W REFLEX MICROSCOPIC
Bilirubin Urine: NEGATIVE
Hgb urine dipstick: NEGATIVE
Ketones, ur: NEGATIVE
Leukocytes,Ua: NEGATIVE
Nitrite: NEGATIVE
RBC / HPF: NONE SEEN (ref 0–?)
Specific Gravity, Urine: 1.02 (ref 1.000–1.030)
Total Protein, Urine: NEGATIVE
Urine Glucose: NEGATIVE
Urobilinogen, UA: 1 (ref 0.0–1.0)
pH: 7 (ref 5.0–8.0)

## 2022-04-17 LAB — PSA: PSA: 0.35 ng/mL (ref 0.10–4.00)

## 2022-04-17 LAB — RPR: RPR Ser Ql: NONREACTIVE

## 2022-04-17 LAB — HIV ANTIBODY (ROUTINE TESTING W REFLEX): HIV 1&2 Ab, 4th Generation: NONREACTIVE

## 2022-04-18 ENCOUNTER — Other Ambulatory Visit (HOSPITAL_COMMUNITY): Payer: Self-pay

## 2022-04-18 NOTE — Telephone Encounter (Signed)
Ran test claim, came back refill too soon. Called pharmacy, prescription is ready for pickup.

## 2022-04-22 ENCOUNTER — Other Ambulatory Visit: Payer: Self-pay | Admitting: Family Medicine

## 2022-04-22 ENCOUNTER — Other Ambulatory Visit: Payer: Self-pay

## 2022-04-22 ENCOUNTER — Telehealth: Payer: Self-pay | Admitting: Family Medicine

## 2022-04-22 DIAGNOSIS — N5201 Erectile dysfunction due to arterial insufficiency: Secondary | ICD-10-CM

## 2022-04-22 MED ORDER — TADALAFIL 20 MG PO TABS: 10.0000 mg | ORAL_TABLET | ORAL | 11 refills | Status: DC | PRN

## 2022-04-22 NOTE — Telephone Encounter (Signed)
Rx pended and ready to be sent by provider.

## 2022-04-22 NOTE — Telephone Encounter (Signed)
Pt want to know if he can charge his [employer] and not medicaid because medicaid will not cover his cialis. Please give the pt a call

## 2022-04-22 NOTE — Telephone Encounter (Signed)
Caller Name: Story Call back phone #: [phone]   MEDICATION(S):  Cialis   Days of Med Remaining: 0  Has the patient contacted their pharmacy (YES/NO)? yes What did pharmacy advise?   Preferred Pharmacy: hid med was sent to [facility] and he needs it sent to  [facility] in [place]. Fax # [phone] Tel # [phone]  ~~~Please advise patient/caregiver to allow 2-3 business days to process RX refills.

## 2022-04-22 NOTE — Telephone Encounter (Signed)
Patient informed that rx was sent to pharmacy, aware to contact our should it require a PA.

## 2022-04-25 DIAGNOSIS — Z419 Encounter for procedure for purposes other than remedying health state, unspecified: Secondary | ICD-10-CM | POA: Diagnosis not present

## 2022-05-24 DIAGNOSIS — Z419 Encounter for procedure for purposes other than remedying health state, unspecified: Secondary | ICD-10-CM | POA: Diagnosis not present

## 2022-06-24 DIAGNOSIS — Z419 Encounter for procedure for purposes other than remedying health state, unspecified: Secondary | ICD-10-CM | POA: Diagnosis not present

## 2022-09-02 ENCOUNTER — Encounter: Payer: Self-pay | Admitting: Family Medicine

## 2022-09-02 ENCOUNTER — Ambulatory Visit (INDEPENDENT_AMBULATORY_CARE_PROVIDER_SITE_OTHER): Payer: Managed Care, Other (non HMO) | Admitting: Family Medicine

## 2022-09-02 VITALS — BP 116/70 | HR 65 | Temp 98.6°F | Ht 73.0 in | Wt 186.4 lb

## 2022-09-02 DIAGNOSIS — M549 Dorsalgia, unspecified: Secondary | ICD-10-CM

## 2022-09-02 DIAGNOSIS — D3A026 Benign carcinoid tumor of the rectum: Secondary | ICD-10-CM

## 2022-09-02 DIAGNOSIS — G629 Polyneuropathy, unspecified: Secondary | ICD-10-CM | POA: Diagnosis not present

## 2022-09-02 DIAGNOSIS — N5201 Erectile dysfunction due to arterial insufficiency: Secondary | ICD-10-CM | POA: Diagnosis not present

## 2022-09-02 DIAGNOSIS — G8929 Other chronic pain: Secondary | ICD-10-CM | POA: Diagnosis not present

## 2022-09-02 DIAGNOSIS — M21371 Foot drop, right foot: Secondary | ICD-10-CM | POA: Diagnosis not present

## 2022-09-02 DIAGNOSIS — Z0000 Encounter for general adult medical examination without abnormal findings: Secondary | ICD-10-CM

## 2022-09-02 MED ORDER — PREGABALIN 75 MG PO CAPS: 75.0000 mg | ORAL_CAPSULE | Freq: Three times a day (TID) | ORAL | 0 refills | Status: DC

## 2022-09-02 MED ORDER — TADALAFIL 20 MG PO TABS: 10.0000 mg | ORAL_TABLET | ORAL | 11 refills | Status: DC | PRN

## 2022-09-02 NOTE — Progress Notes (Signed)
Established Patient Office Visit   Subjective:  Patient ID: Thomas Rocha, male    DOB: 09/23/1970  Age: 52 y.o. MRN: [id number]  Chief Complaint  Patient presents with   Medical Management of Chronic Issues    Evaluation for AFO's on right foot. Needing prescription for AFO's. No concerns. Would like referral to pain clinic.     HPI Encounter Diagnoses  Name Primary?   Chronic back pain, unspecified back location, unspecified back pain laterality Yes   Erectile dysfunction due to arterial insufficiency    Foot drop, right    Healthcare maintenance    Carcinoid tumor of rectum, unspecified whether malignant    Neuropathy    For follow-up of above.  Continues to deal with chronic back pain and right foot drop.  Has been lost to follow-up for carcinoid tumor of the rectum.  Request refill for Lyrica for back pain.   Review of Systems  Constitutional: Negative.   HENT: Negative.    Eyes:  Negative for blurred vision, discharge and redness.  Respiratory: Negative.    Cardiovascular: Negative.   Gastrointestinal:  Negative for abdominal pain.  Genitourinary: Negative.   Musculoskeletal:  Positive for back pain. Negative for myalgias.  Skin:  Negative for rash.  Neurological:  Positive for weakness. Negative for tingling and loss of consciousness.  Endo/Heme/Allergies:  Negative for polydipsia.     Current Outpatient Medications:    AMBULATORY NON FORMULARY MEDICATION, Ankle Foot Orthosis for right leg foot drop.  Disp 1. M21.371, Disp: 1 each, Rfl: 0   ibuprofen (ADVIL) 200 MG tablet, Take 200-400 mg by mouth every 8 (eight) hours as needed (pain.)., Disp: , Rfl:    Misc. Devices MISC, AFO for left foot, Disp: , Rfl:    tamsulosin (FLOMAX) 0.4 MG CAPS capsule, Take 1 capsule (0.4 mg total) by mouth at bedtime., Disp: 90 capsule, Rfl: 2   terbinafine (LAMISIL) 250 MG tablet, Take 1 tablet (250 mg total) by mouth daily., Disp: 90 tablet, Rfl: 0   buPROPion (WELLBUTRIN XL) 300  MG 24 hr tablet, Take 1 tablet (300 mg total) by mouth daily. (Patient not taking: Reported on 09/02/2022), Disp: 90 tablet, Rfl: 1   pregabalin (LYRICA) 75 MG capsule, Take 1 capsule (75 mg total) by mouth 3 (three) times daily., Disp: 90 capsule, Rfl: 0   tadalafil (CIALIS) 20 MG tablet, Take 0.5-1 tablets (10-20 mg total) by mouth every other day as needed for erectile dysfunction., Disp: 10 tablet, Rfl: 11   Objective:     BP 116/70 (BP Location: Right Arm, Patient Position: Sitting, Cuff Size: Normal)   Pulse 65   Temp 98.6 F (37 C) (Temporal)   Ht 6\' 1"  (1.854 m)   Wt 186 lb 6.4 oz (84.6 kg)   SpO2 96%   BMI 24.59 kg/m    Physical Exam Constitutional:      General: He is not in acute distress.    Appearance: Normal appearance. He is not ill-appearing, toxic-appearing or diaphoretic.  HENT:     Head: Normocephalic and atraumatic.     Right Ear: External ear normal.     Left Ear: External ear normal.  Eyes:     General: No scleral icterus.       Right eye: No discharge.        Left eye: No discharge.     Extraocular Movements: Extraocular movements intact.     Conjunctiva/sclera: Conjunctivae normal.  Pulmonary:     Effort: Pulmonary effort is  normal. No respiratory distress.  Skin:    General: Skin is warm and dry.  Neurological:     Mental Status: He is alert and oriented to person, place, and time.  Psychiatric:        Mood and Affect: Mood normal.        Behavior: Behavior normal.      No results found for any visits on 09/02/22.    The 10-year ASCVD risk score (Arnett DK, et al., 2019) is: 5%    Assessment & Plan:   Chronic back pain, unspecified back location, unspecified back pain laterality -     Pregabalin; Take 1 capsule (75 mg total) by mouth 3 (three) times daily.  Dispense: 90 capsule; Refill: 0 -     Ambulatory referral to Neurosurgery  Erectile dysfunction due to arterial insufficiency -     Tadalafil; Take 0.5-1 tablets (10-20 mg total)  by mouth every other day as needed for erectile dysfunction.  Dispense: 10 tablet; Refill: 11  Foot drop, right -     Ambulatory referral to Neurosurgery  Healthcare maintenance  Carcinoid tumor of rectum, unspecified whether malignant -     Ambulatory referral to Gastroenterology  Neuropathy -     Ambulatory referral to Neurosurgery    Return Return fasting for physical exam in the next few months.Mliss Sax, MD

## 2022-10-29 ENCOUNTER — Encounter: Payer: Self-pay | Admitting: Family Medicine

## 2022-10-29 ENCOUNTER — Ambulatory Visit: Payer: Managed Care, Other (non HMO) | Admitting: Family Medicine

## 2022-10-29 VITALS — BP 122/68 | HR 67 | Temp 98.3°F | Ht 73.0 in | Wt 185.4 lb

## 2022-10-29 DIAGNOSIS — D3A026 Benign carcinoid tumor of the rectum: Secondary | ICD-10-CM | POA: Diagnosis not present

## 2022-10-29 DIAGNOSIS — Z0000 Encounter for general adult medical examination without abnormal findings: Secondary | ICD-10-CM | POA: Diagnosis not present

## 2022-10-29 DIAGNOSIS — B001 Herpesviral vesicular dermatitis: Secondary | ICD-10-CM | POA: Diagnosis not present

## 2022-10-29 DIAGNOSIS — E7800 Pure hypercholesterolemia, unspecified: Secondary | ICD-10-CM | POA: Diagnosis not present

## 2022-10-29 DIAGNOSIS — F339 Major depressive disorder, recurrent, unspecified: Secondary | ICD-10-CM

## 2022-10-29 LAB — LIPID PANEL
Cholesterol: 156 mg/dL (ref 0–200)
HDL: 46 mg/dL (ref >39.00)
LDL Cholesterol: 98 mg/dL (ref 0–99)
NonHDL: 109.6
Total CHOL/HDL Ratio: 3
Triglycerides: 59 mg/dL (ref 0.0–149.0)
VLDL: 11.8 mg/dL (ref 0.0–40.0)

## 2022-10-29 LAB — CBC WITH DIFFERENTIAL/PLATELET
Basophils Absolute: 0 10*3/uL (ref 0.0–0.1)
Basophils Relative: 0.9 % (ref 0.0–3.0)
Eosinophils Absolute: 0.1 10*3/uL (ref 0.0–0.7)
Eosinophils Relative: 5 % (ref 0.0–5.0)
HCT: 41.2 % (ref 39.0–52.0)
Hemoglobin: 13.7 g/dL (ref 13.0–17.0)
Lymphocytes Relative: 35.8 % (ref 12.0–46.0)
Lymphs Abs: 1 10*3/uL (ref 0.7–4.0)
MCHC: 33.3 g/dL (ref 30.0–36.0)
MCV: 96.1 fl (ref 78.0–100.0)
Monocytes Absolute: 0.3 10*3/uL (ref 0.1–1.0)
Monocytes Relative: 10.9 % (ref 3.0–12.0)
Neutro Abs: 1.3 10*3/uL — ABNORMAL LOW (ref 1.4–7.7)
Neutrophils Relative %: 47.4 % (ref 43.0–77.0)
Platelets: 205 10*3/uL (ref 150.0–400.0)
RBC: 4.29 Mil/uL (ref 4.22–5.81)
RDW: 13.4 % (ref 11.5–15.5)
WBC: 2.8 10*3/uL — ABNORMAL LOW (ref 4.0–10.5)

## 2022-10-29 LAB — URINALYSIS, ROUTINE W REFLEX MICROSCOPIC
Bilirubin Urine: NEGATIVE
Hgb urine dipstick: NEGATIVE
Ketones, ur: NEGATIVE
Leukocytes,Ua: NEGATIVE
Nitrite: NEGATIVE
RBC / HPF: NONE SEEN (ref 0–?)
Specific Gravity, Urine: 1.02 (ref 1.000–1.030)
Total Protein, Urine: NEGATIVE
Urine Glucose: NEGATIVE
Urobilinogen, UA: 0.2 (ref 0.0–1.0)
pH: 6.5 (ref 5.0–8.0)

## 2022-10-29 LAB — COMPREHENSIVE METABOLIC PANEL
ALT: 17 U/L (ref 0–53)
AST: 22 U/L (ref 0–37)
Albumin: 4 g/dL (ref 3.5–5.2)
Alkaline Phosphatase: 38 U/L — ABNORMAL LOW (ref 39–117)
BUN: 8 mg/dL (ref 6–23)
CO2: 27 mEq/L (ref 19–32)
Calcium: 9 mg/dL (ref 8.4–10.5)
Chloride: 107 mEq/L (ref 96–112)
Creatinine, Ser: 0.75 mg/dL (ref 0.40–1.50)
GFR: 104.39 mL/min (ref >60.00)
Glucose, Bld: 80 mg/dL (ref 70–99)
Potassium: 4.2 mEq/L (ref 3.5–5.1)
Sodium: 142 mEq/L (ref 135–145)
Total Bilirubin: 0.5 mg/dL (ref 0.2–1.2)
Total Protein: 6.2 g/dL (ref 6.0–8.3)

## 2022-10-29 MED ORDER — VALACYCLOVIR HCL 500 MG PO TABS
500.0000 mg | ORAL_TABLET | Freq: Two times a day (BID) | ORAL | 3 refills | Status: AC
Start: 2022-10-29 — End: 2022-11-01

## 2022-10-29 NOTE — Progress Notes (Signed)
Established Patient Office Visit   Subjective:  Patient ID: Thomas Rocha, male    DOB: 1970-06-20  Age: 52 y.o. MRN: [id number]  Chief Complaint  Patient presents with   Annual Exam    CPE. Pt is doing well.     HPI Encounter Diagnoses  Name Primary?   Healthcare maintenance Yes   Carcinoid tumor of rectum, unspecified whether malignant    Depression, recurrent (HCC)    Herpes labialis    Elevated LDL cholesterol level    For health check and follow-up of above.  Continues follow-up with neurology for foot drop and back pain.  He continues Lyrica for this.  He had been referred to gastroenterology for follow-up of his carcinoid tumor but says that he never heard from them.  He has fever blisters around his mouth from time to time and wishes treatment for this.  He has quit smoking but continues to vape some.  He does not drink alcohol through the week but drinks more heavily on the weekends.  He is smoking marijuana regularly for control of his back pain.  Continues Wellbutrin for depression because it is helping.   Review of Systems  Constitutional: Negative.   HENT: Negative.    Eyes:  Negative for blurred vision, discharge and redness.  Respiratory: Negative.    Cardiovascular: Negative.   Gastrointestinal:  Negative for abdominal pain.  Genitourinary: Negative.   Musculoskeletal:  Positive for back pain. Negative for myalgias.  Skin:  Negative for rash.  Neurological:  Negative for tingling, loss of consciousness and weakness.  Endo/Heme/Allergies:  Negative for polydipsia.     Current Outpatient Medications:    AMBULATORY NON FORMULARY MEDICATION, Ankle Foot Orthosis for right leg foot drop.  Disp 1. M21.371, Disp: 1 each, Rfl: 0   buPROPion (WELLBUTRIN XL) 300 MG 24 hr tablet, Take 1 tablet (300 mg total) by mouth daily., Disp: 90 tablet, Rfl: 1   ibuprofen (ADVIL) 200 MG tablet, Take 200-400 mg by mouth every 8 (eight) hours as needed (pain.)., Disp: , Rfl:     Misc. Devices MISC, AFO for left foot, Disp: , Rfl:    pregabalin (LYRICA) 75 MG capsule, Take 1 capsule (75 mg total) by mouth 3 (three) times daily., Disp: 90 capsule, Rfl: 0   tadalafil (CIALIS) 20 MG tablet, Take 0.5-1 tablets (10-20 mg total) by mouth every other day as needed for erectile dysfunction., Disp: 10 tablet, Rfl: 11   tamsulosin (FLOMAX) 0.4 MG CAPS capsule, Take 1 capsule (0.4 mg total) by mouth at bedtime., Disp: 90 capsule, Rfl: 2   terbinafine (LAMISIL) 250 MG tablet, Take 1 tablet (250 mg total) by mouth daily., Disp: 90 tablet, Rfl: 0   valACYclovir (VALTREX) 500 MG tablet, Take 1 tablet (500 mg total) by mouth 2 (two) times daily for 3 days. As needed for fever blister., Disp: 6 tablet, Rfl: 3   Objective:     BP 122/68   Pulse 67   Temp 98.3 F (36.8 C)   Ht 6\' 1"  (1.854 m)   Wt 185 lb 6.4 oz (84.1 kg)   SpO2 95%   BMI 24.46 kg/m    Physical Exam Constitutional:      General: He is not in acute distress.    Appearance: Normal appearance. He is not ill-appearing, toxic-appearing or diaphoretic.  HENT:     Head: Normocephalic and atraumatic.     Right Ear: External ear normal.     Left Ear: External ear normal.  Mouth/Throat:     Mouth: Mucous membranes are moist.     Pharynx: Oropharynx is clear. No oropharyngeal exudate or posterior oropharyngeal erythema.  Eyes:     General: No scleral icterus.       Right eye: No discharge.        Left eye: No discharge.     Extraocular Movements: Extraocular movements intact.     Conjunctiva/sclera: Conjunctivae normal.     Pupils: Pupils are equal, round, and reactive to light.  Cardiovascular:     Rate and Rhythm: Normal rate and regular rhythm.  Pulmonary:     Effort: Pulmonary effort is normal. No respiratory distress.     Breath sounds: Normal breath sounds.  Abdominal:     General: Bowel sounds are normal.     Tenderness: There is no abdominal tenderness. There is no guarding or rebound.     Hernia:  There is no hernia in the left inguinal area or right inguinal area.  Genitourinary:    Penis: Circumcised. No hypospadias, erythema, tenderness, discharge, swelling or lesions.      Testes:        Right: Mass, tenderness or swelling not present. Right testis is descended.        Left: Mass, tenderness or swelling not present. Left testis is descended.     Epididymis:     Right: Not inflamed or enlarged.     Left: Not inflamed or enlarged.  Musculoskeletal:     Cervical back: No rigidity or tenderness.  Lymphadenopathy:     Lower Body: No right inguinal adenopathy. No left inguinal adenopathy.  Skin:    General: Skin is warm and dry.  Neurological:     Mental Status: He is alert and oriented to person, place, and time.  Psychiatric:        Mood and Affect: Mood normal.        Behavior: Behavior normal.      No results found for any visits on 10/29/22.    The 10-year ASCVD risk score (Arnett DK, et al., 2019) is: 5.4%    Assessment & Plan:   Healthcare maintenance -     CBC with Differential/Platelet -     Comprehensive metabolic panel -     Urinalysis, Routine w reflex microscopic  Carcinoid tumor of rectum, unspecified whether malignant -     Ambulatory referral to Gastroenterology  Depression, recurrent (HCC)  Herpes labialis -     valACYclovir HCl; Take 1 tablet (500 mg total) by mouth 2 (two) times daily for 3 days. As needed for fever blister.  Dispense: 6 tablet; Refill: 3  Elevated LDL cholesterol level -     Lipid panel    Return in about 1 year (around 10/29/2023), or if symptoms worsen or fail to improve.  Continue follow-up with neurology for foot drop and back pain.  Continue Lyrica through neurology.  Continue Wellbutrin for depression because it is helping.  Advised that he consume no more than 2 alcoholic drinks per day.  Encouraged him to go ahead and discontinue vaping.  Information was given on preventing elevated cholesterol as well as health  maintenance and preventative care.  Mliss Sax, MD

## 2022-11-05 ENCOUNTER — Telehealth: Payer: Self-pay | Admitting: Family Medicine

## 2022-11-05 DIAGNOSIS — Z23 Encounter for immunization: Secondary | ICD-10-CM

## 2022-11-05 NOTE — Telephone Encounter (Signed)
Jessica from [facility] in [place] called [phone] wanted to know if you can put in a prescription for a shingle shot

## 2022-12-17 ENCOUNTER — Other Ambulatory Visit: Payer: Self-pay

## 2022-12-17 ENCOUNTER — Telehealth: Payer: Self-pay | Admitting: Gastroenterology

## 2022-12-17 DIAGNOSIS — D3A026 Benign carcinoid tumor of the rectum: Secondary | ICD-10-CM

## 2022-12-17 NOTE — Telephone Encounter (Signed)
Lower EUS has been set up for 02/10/23 at 115 pm at [facility] with GM

## 2022-12-17 NOTE — Telephone Encounter (Signed)
PT is calling to find out his recall date. Dr. Meridee Score did not place one and has him redoing it 2033. He wants to know why its not every 3 years and when will he be able to schedule. Please advise.

## 2022-12-18 ENCOUNTER — Other Ambulatory Visit: Payer: Self-pay

## 2022-12-18 NOTE — Telephone Encounter (Signed)
EUS scheduled, pt instructed and medications reviewed.  Patient instructions mailed to home.  Patient to call with any questions or concerns.  

## 2023-01-29 ENCOUNTER — Telehealth: Payer: Self-pay | Admitting: Gastroenterology

## 2023-01-29 NOTE — Telephone Encounter (Signed)
Inbound call from patient wishing to discuss insurance coverage for 11/18 procedure. Patient requesting a call to discuss further. Please advise, thank you.

## 2023-02-03 ENCOUNTER — Encounter (HOSPITAL_COMMUNITY): Payer: Self-pay | Admitting: Gastroenterology

## 2023-02-03 NOTE — Progress Notes (Signed)
Attempted to obtain medical history via telephone, unable to reach at this time. Unable to leave voicemail to return pre surgical testing department's phone call,due to mailbox full.  

## 2023-02-10 ENCOUNTER — Ambulatory Visit (HOSPITAL_COMMUNITY): Payer: Self-pay

## 2023-02-10 ENCOUNTER — Other Ambulatory Visit: Payer: Self-pay

## 2023-02-10 ENCOUNTER — Encounter (HOSPITAL_COMMUNITY): Payer: Self-pay | Admitting: Gastroenterology

## 2023-02-10 ENCOUNTER — Encounter (HOSPITAL_COMMUNITY)
Admission: RE | Disposition: A | Discharge status: Home / Self Care | Payer: Self-pay | Source: Home / Self Care | Attending: Gastroenterology

## 2023-02-10 ENCOUNTER — Ambulatory Visit (HOSPITAL_COMMUNITY)
Admission: RE | Admit: 2023-02-10 | Discharge: 2023-02-10 | Disposition: A | Discharge status: Home / Self Care | Payer: Medicaid Other | Attending: Gastroenterology | Admitting: Gastroenterology

## 2023-02-10 DIAGNOSIS — K649 Unspecified hemorrhoids: Secondary | ICD-10-CM | POA: Diagnosis not present

## 2023-02-10 DIAGNOSIS — K219 Gastro-esophageal reflux disease without esophagitis: Secondary | ICD-10-CM | POA: Diagnosis not present

## 2023-02-10 DIAGNOSIS — Z08 Encounter for follow-up examination after completed treatment for malignant neoplasm: Secondary | ICD-10-CM

## 2023-02-10 DIAGNOSIS — Z09 Encounter for follow-up examination after completed treatment for conditions other than malignant neoplasm: Secondary | ICD-10-CM | POA: Insufficient documentation

## 2023-02-10 DIAGNOSIS — Z9889 Other specified postprocedural states: Secondary | ICD-10-CM | POA: Insufficient documentation

## 2023-02-10 DIAGNOSIS — D3A026 Benign carcinoid tumor of the rectum: Secondary | ICD-10-CM

## 2023-02-10 DIAGNOSIS — K641 Second degree hemorrhoids: Secondary | ICD-10-CM | POA: Insufficient documentation

## 2023-02-10 DIAGNOSIS — Z87891 Personal history of nicotine dependence: Secondary | ICD-10-CM | POA: Diagnosis not present

## 2023-02-10 DIAGNOSIS — Z85048 Personal history of other malignant neoplasm of rectum, rectosigmoid junction, and anus: Secondary | ICD-10-CM | POA: Diagnosis not present

## 2023-02-10 HISTORY — PX: FLEXIBLE SIGMOIDOSCOPY: SHX5431

## 2023-02-10 HISTORY — PX: EUS: SHX5427

## 2023-02-10 SURGERY — ULTRASOUND, LOWER GI TRACT, ENDOSCOPIC
Anesthesia: Monitor Anesthesia Care

## 2023-02-10 MED ORDER — LIDOCAINE 2% (20 MG/ML) 5 ML SYRINGE
INTRAMUSCULAR | Status: DC | PRN
  Administered 2023-02-10: 100 mg via INTRAVENOUS

## 2023-02-10 MED ORDER — PROPOFOL 10 MG/ML IV BOLUS
INTRAVENOUS | Status: DC | PRN
  Administered 2023-02-10: 30 mg via INTRAVENOUS
  Administered 2023-02-10 (×4): 20 mg via INTRAVENOUS

## 2023-02-10 MED ORDER — SODIUM CHLORIDE 0.9 % IV SOLN
INTRAVENOUS | Status: DC
Start: 2023-02-10 — End: 2023-02-10

## 2023-02-10 MED ORDER — PROPOFOL 500 MG/50ML IV EMUL
INTRAVENOUS | Status: DC | PRN
  Administered 2023-02-10: 125 ug/kg/min via INTRAVENOUS

## 2023-02-10 NOTE — Transfer of Care (Signed)
Immediate Anesthesia Transfer of Care Note  Patient: Thomas Rocha  Procedure(s) Performed: LOWER ENDOSCOPIC ULTRASOUND (EUS) FLEXIBLE SIGMOIDOSCOPY  Patient Location: Endoscopy Unit  Anesthesia Type:MAC  Level of Consciousness: drowsy  Airway & Oxygen Therapy: Patient Spontanous Breathing and Patient connected to face mask oxygen  Post-op Assessment: Report given to RN and Post -op Vital signs reviewed and stable  Post vital signs: Reviewed and stable  Last Vitals:  Vitals Value Taken Time  BP    Temp    Pulse 50 02/10/23 1313  Resp 15 02/10/23 1313  SpO2 100 % 02/10/23 1313  Vitals shown include unfiled device data.  Last Pain:  Vitals:   02/10/23 1041  TempSrc: Temporal  PainSc: 0-No pain         Complications: No notable events documented.

## 2023-02-10 NOTE — H&P (Signed)
GASTROENTEROLOGY PROCEDURE H&P NOTE   Primary Care Physician: Mliss Sax, MD  HPI: Thomas Rocha is a 52 y.o. 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  Past Medical History:  Diagnosis Date   Anxiety    pt denies   Arthritis    Depression    Foot drop    Bilateral feet   GERD (gastroesophageal reflux disease)    Past Surgical History:  Procedure Laterality Date   CERVICAL LAMINECTOMY     ENDOSCOPIC MUCOSAL RESECTION N/A 11/15/2021   Procedure: ENDOSCOPIC MUCOSAL RESECTION;  Surgeon: Meridee Score Netty Starring., MD;  Location: [facility];  Service: Gastroenterology;  Laterality: N/A;   EUS N/A 11/15/2021   Procedure: LOWER ENDOSCOPIC ULTRASOUND (EUS);  Surgeon: Lemar Lofty., MD;  Location: [facility];  Service: Gastroenterology;  Laterality: N/A;   FLEXIBLE SIGMOIDOSCOPY N/A 11/15/2021   Procedure: FLEXIBLE SIGMOIDOSCOPY;  Surgeon: Meridee Score Netty Starring., MD;  Location: [facility];  Service: Gastroenterology;  Laterality: N/A;   HEMOSTASIS CLIP PLACEMENT  11/15/2021   Procedure: HEMOSTASIS CLIP PLACEMENT;  Surgeon: Lemar Lofty., MD;  Location: [facility];  Service: Gastroenterology;;   HERNIA REPAIR     INGUINAL HERNIA REPAIR Right 11/23/2021   Procedure: OPEN RIGHT INGUINAL HERNIA REPAIR;  Surgeon: Luretha Murphy, MD;  Location: [facility];  Service: General;  Laterality: Right;   LUMBAR FUSION Bilateral 09/02/2019   L4-L5   LUMBAR LAMINECTOMY Bilateral 09/02/2019   MASS EXCISION Left 11/23/2021   Procedure: EXCISION OF LEFT CHEEK MASS;  Surgeon: Luretha Murphy, MD;  Location: [facility];  Service: General;  Laterality: Left;   SUBMUCOSAL LIFTING INJECTION  11/15/2021   Procedure: SUBMUCOSAL LIFTING INJECTION;  Surgeon: Lemar Lofty., MD;  Location: [facility];  Service: Gastroenterology;;   SUBMUCOSAL TATTOO INJECTION  11/15/2021   Procedure: SUBMUCOSAL TATTOO INJECTION;  Surgeon:  Lemar Lofty., MD;  Location: [facility];  Service: Gastroenterology;;   Current Facility-Administered Medications  Medication Dose Route Frequency Provider Last Rate Last Admin   0.9 %  sodium chloride infusion   Intravenous Continuous Mansouraty, Netty Starring., MD        Current Facility-Administered Medications:    0.9 %  sodium chloride infusion, , Intravenous, Continuous, Mansouraty, Netty Starring., MD No Known Allergies Family History  Problem Relation Age of Onset   Pancreatic cancer Mother    Cancer Mother    Stroke Mother    Cirrhosis Father    Diabetes Maternal Grandmother    Hypertension Maternal Grandmother    Colon cancer Neg Hx    Esophageal cancer Neg Hx    Stomach cancer Neg Hx    Rectal cancer Neg Hx    Social History   Socioeconomic History   Marital status: Single    Spouse name: Not on file   Number of children: Not on file   Years of education: Not on file   Highest education level: Not on file  Occupational History   Not on file  Tobacco Use   Smoking status: Former    Current packs/day: 0.00    Average packs/day: 1 pack/day for 10.0 years (10.0 ttl pk-yrs)    Types: Cigarettes    Start date: 2009    Quit date: 2019    Years since quitting: 5.8   Smokeless tobacco: Never  Vaping Use   Vaping status: Former  Substance and Sexual Activity   Alcohol use: Yes    Alcohol/week: 5.0 standard drinks of alcohol  Types: 5 Cans of beer per week    Comment: 5 beers three times a week   Drug use: Not Currently    Types: Cocaine, Marijuana   Sexual activity: Yes  Other Topics Concern   Not on file  Social History Narrative   Not on file   Social Determinants of Health   Financial Resource Strain: Medium Risk (11/15/2022)   Received from [facility]   Overall Financial Resource Strain (CARDIA)    Difficulty of Paying Living Expenses: Somewhat hard  Food Insecurity: Food Insecurity Present (11/15/2022)   Received from [facility]    Hunger Vital Sign    Worried About Running Out of Food in the Last Year: Sometimes true    Ran Out of Food in the Last Year: Sometimes true  Transportation Needs: No Transportation Needs (11/15/2022)   Received from [facility] - Transportation    Lack of Transportation (Medical): No    Lack of Transportation (Non-Medical): No  Physical Activity: Unknown (11/15/2022)   Received from [facility]   Exercise Vital Sign    Days of Exercise per Week: 0 days    Minutes of Exercise per Session: Not on file  Stress: Stress Concern Present (11/15/2022)   Received from [facility] - Occupational Stress Questionnaire    Feeling of Stress : To some extent  Social Connections: Socially Isolated (11/15/2022)   Received from [facility]   Social Network    How would you rate your social network (family, work, friends)?: Little participation, lonely and socially isolated  Intimate Partner Violence: Not At Risk (11/15/2022)   Received from [facility]   HITS    Over the last 12 months how often did your partner physically hurt you?: Never    Over the last 12 months how often did your partner insult you or talk down to you?: Sometimes    Over the last 12 months how often did your partner threaten you with physical harm?: Never    Over the last 12 months how often did your partner scream or curse at you?: Sometimes    Physical Exam: Today's Vitals   02/10/23 1041  BP: 132/79  Resp: 16  Temp: (!) 97.5 F (36.4 C)  TempSrc: Temporal  SpO2: 98%  Weight: 81.6 kg  Height: 6\' 1"  (1.854 m)  PainSc: 0-No pain   Body mass index is 23.75 kg/m. GEN: NAD EYE: Sclerae anicteric ENT: MMM CV: Non-tachycardic GI: Soft, NT/ND NEURO:  Alert & Oriented x 3  Lab Results: No results for input(s): "WBC", "HGB", "HCT", "PLT" in the last 72 hours. BMET No results for input(s): "NA", "K", "CL", "CO2", "GLUCOSE", "BUN", "CREATININE", "CALCIUM" in  the last 72 hours. LFT No results for input(s): "PROT", "ALBUMIN", "AST", "ALT", "ALKPHOS", "BILITOT", "BILIDIR", "IBILI" in the last 72 hours. PT/INR No results for input(s): "LABPROT", "INR" in the last 72 hours.   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  The risks and benefits of endoscopic evaluation/treatment were discussed with the patient and/or family; these include but are not limited to the risk of perforation, infection, bleeding, missed lesions, lack of diagnosis, severe illness requiring hospitalization, as well as anesthesia and sedation related illnesses.  The patient's history has been reviewed, patient examined, no change in status, and deemed stable for procedure.  The patient and/or family is agreeable to proceed.  Corliss Parish, MD [facility] Office # [phone]

## 2023-02-10 NOTE — Discharge Instructions (Signed)

## 2023-02-10 NOTE — Anesthesia Preprocedure Evaluation (Signed)
Anesthesia Evaluation  Patient identified by MRN, date of birth, ID band Patient awake    Reviewed: Allergy & Precautions, NPO status , Patient's Chart, lab work & pertinent test results  History of Anesthesia Complications Negative for: history of anesthetic complications  Airway Mallampati: II  TM Distance: >3 FB Neck ROM: Full    Dental  (+) Dental Advisory Given, Teeth Intact   Pulmonary former smoker   Pulmonary exam normal        Cardiovascular negative cardio ROS Normal cardiovascular exam     Neuro/Psych  PSYCHIATRIC DISORDERS Anxiety Depression     Neuromuscular disease    GI/Hepatic Neg liver ROS,,,  Endo/Other  negative endocrine ROS    Renal/GU negative Renal ROS  negative genitourinary   Musculoskeletal  (+) Arthritis ,    Abdominal Normal abdominal exam  (+)   Peds  Hematology negative hematology ROS (+)   Anesthesia Other Findings   Reproductive/Obstetrics                             Anesthesia Physical Anesthesia Plan  ASA: 2  Anesthesia Plan: MAC   Post-op Pain Management: Minimal or no pain anticipated   Induction:   PONV Risk Score and Plan: 1 and Propofol infusion, Treatment may vary due to age or medical condition and TIVA  Airway Management Planned: Nasal Cannula, Natural Airway and Simple Face Mask  Additional Equipment: None  Intra-op Plan:   Post-operative Plan:   Informed Consent: I have reviewed the patients History and Physical, chart, labs and discussed the procedure including the risks, benefits and alternatives for the proposed anesthesia with the patient or authorized representative who has indicated his/her understanding and acceptance.       Plan Discussed with: CRNA  Anesthesia Plan Comments:         Anesthesia Quick Evaluation

## 2023-02-10 NOTE — Op Note (Signed)
[facility] Patient Name: Thomas Rocha Procedure Date: 02/10/2023 MRN: [id number] Attending MD: Corliss Parish , MD, [id number] Date of Birth: Oct 25, 1970 CSN: [id number] Age: 52 Admit Type: Outpatient Procedure:                Lower EUS Indications:              Post-op endosonographic surveillance for                            Rectosigmoid Neuroendocrine Tumor (s/p Endoscopic                            resection) Providers:                Corliss Parish, MD, Norman Clay, RN, Harrington Challenger,                            Technician Referring MD:             Dub Amis. Tomasa Rand, MD Medicines:                Monitored Anesthesia Care Complications:            No immediate complications. Estimated Blood Loss:     Estimated blood loss: none. Procedure:                Pre-Anesthesia Assessment:                           - Prior to the procedure, a History and Physical                            was performed, and patient medications and                            allergies were reviewed. The patient's tolerance of                            previous anesthesia was also reviewed. The risks                            and benefits of the procedure and the sedation                            options and risks were discussed with the patient.                            All questions were answered, and informed consent                            was obtained. Prior Anticoagulants: The patient has                            taken no anticoagulant or antiplatelet agents. ASA                            Grade Assessment: II - A  patient with mild systemic                            disease. After reviewing the risks and benefits,                            the patient was deemed in satisfactory condition to                            undergo the procedure.                           After obtaining informed consent, the endoscope was                            passed under direct  vision. Throughout the                            procedure, the patient's blood pressure, pulse, and                            oxygen saturations were monitored continuously. The                            [id number] ([id number]) Olympus therapeutic endoscope                            was introduced through the anus and advanced to the                            the sigmoid colon for ultrasound. The [id number]                            ([id number]) Olympus radial ultrasound scope was                            introduced through the anus and advanced to the the                            sigmoid colon for ultrasound. The lower EUS was                            accomplished without difficulty. The patient                            tolerated the procedure. The quality of the bowel                            preparation was adequate. Findings:      The digital rectal exam findings include hemorrhoids. Pertinent       negatives include no palpable rectal lesions.      ENDOSCOPIC FINDING: :      A small post mucosectomy scar was found in the recto-sigmoid colon.       There was no evidence  of the previous polyp.      Normal mucosa was found in the rectum, in the recto-sigmoid colon and in       the sigmoid colon.      Non-bleeding non-thrombosed internal hemorrhoids were found during       retroflexion, during perianal exam and during digital exam. The       hemorrhoids were Grade II (internal hemorrhoids that prolapse but reduce       spontaneously).      ENDOSONOGRAPHIC FINDING: :      No malignant-appearing lymph nodes were visualized in the perirectal       region and in the left iliac region. The nodes were.      The rectosigmoid junction and sigmoid colon were normal.      The rectum was normal.      The perirectal space was normal.      The internal anal sphincter was visualized endosonographically and       appeared normal. Impression:               FLEX Impression:                            - Hemorrhoids found on digital rectal exam.                           - Post mucosectomy scar in the recto-sigmoid colon                            - tattoo noted in region.                           - Normal mucosa in the rectum, in the recto-sigmoid                            colon and in the sigmoid colon.                           - Non-bleeding non-thrombosed internal hemorrhoids.                           EUS Impression:                           - No malignant-appearing lymph nodes were                            visualized endosonographically in the perirectal                            region and in the left iliac region.                           - Endosonographic imaging showed no sign of                            significant pathology at the rectosigmoid junction  and in the sigmoid colon.                           - Endosonographic images of the rectum were                            unremarkable.                           - Endosonographic images of the perirectal space                            were unremarkable.                           - The internal anal sphincter was visualized                            endosonographically and appeared normal. Moderate Sedation:      Not Applicable - Patient had care per Anesthesia. Recommendation:           - The patient will be observed post-procedure,                            until all discharge criteria are met.                           - Discharge patient to home.                           - Patient has a contact number available for                            emergencies. The signs and symptoms of potential                            delayed complications were discussed with the                            patient. Return to normal activities tomorrow.                            Written discharge instructions were provided to the                            patient.                            - High fiber diet.                           - Use FiberCon 1-2 tablets PO daily.                           - Repeat lower endoscopic ultrasound in 2 years for  surveillance.                           - The findings and recommendations were discussed                            with the patient. Procedure Code(s):        --- Professional ---                           [phone], Sigmoidoscopy, flexible; with endoscopic                            ultrasound examination Diagnosis Code(s):        --- Professional ---                           [phone], Other specified postprocedural states                           K64.1, Second degree hemorrhoids                           I89.9, Noninfective disorder of lymphatic vessels                            and lymph nodes, unspecified                           Z08, Encounter for follow-up examination after                            completed treatment for malignant neoplasm                           Z85.048, Personal history of other malignant                            neoplasm of rectum, rectosigmoid junction, and anus CPT copyright 2022 American Medical Association. All rights reserved. The codes documented in this report are preliminary and upon coder review may  be revised to meet current compliance requirements. Corliss Parish, MD 02/10/2023 1:15:33 PM Number of Addenda: 0

## 2023-02-11 NOTE — Anesthesia Postprocedure Evaluation (Signed)
Anesthesia Post Note  Patient: Thomas Rocha  Procedure(s) Performed: LOWER ENDOSCOPIC ULTRASOUND (EUS) FLEXIBLE SIGMOIDOSCOPY     Patient location during evaluation: Endoscopy Anesthesia Type: MAC Level of consciousness: awake Pain management: pain level controlled Vital Signs Assessment: post-procedure vital signs reviewed and stable Respiratory status: spontaneous breathing Cardiovascular status: stable Postop Assessment: no apparent nausea or vomiting Anesthetic complications: no  No notable events documented.  Last Vitals:  Vitals:   02/10/23 1315 02/10/23 1320  BP: 130/82 135/86  Pulse: (!) 57 (!) 45  Resp: 16 14  Temp:    SpO2: 100% 99%    Last Pain:  Vitals:   02/10/23 1320  TempSrc:   PainSc: 0-No pain                 Caren Macadam

## 2023-02-12 ENCOUNTER — Encounter (HOSPITAL_COMMUNITY): Payer: Self-pay | Admitting: Gastroenterology

## 2023-04-18 ENCOUNTER — Telehealth: Payer: Self-pay | Admitting: Family Medicine

## 2023-04-18 NOTE — Telephone Encounter (Signed)
Copied from CRM [id number]. Topic: Clinical - Medication Refill >> Apr 18, 2023  2:12 PM Ernst Spell wrote: Most Recent Primary Care Visit:  Provider: Mliss Sax  Department: [facility]  Visit Type: PHYSICAL  Date: 10/29/2022  Medication: sildenafil (REVATIO) 20 MG tablet  Has the patient contacted their pharmacy? Yes  They told him to contact his doctor.   Is this the correct pharmacy for this prescription? Yes If no, delete pharmacy and type the correct one.  This is the patient's preferred pharmacy:  [facility] [address] [place] - [address] [place] [address] Phone: [phone] Fax: [phone]   Has the prescription been filled recently? No  Is the patient out of the medication? Yes  Has the patient been seen for an appointment in the last year OR does the patient have an upcoming appointment? No  Can we respond through MyChart? Yes  Agent: Please be advised that Rx refills may take up to 3 business days. We ask that you follow-up with your pharmacy.

## 2023-04-26 HISTORY — PX: NECK SURGERY: SHX720

## 2023-05-08 ENCOUNTER — Other Ambulatory Visit: Payer: Self-pay | Admitting: Family Medicine

## 2023-05-08 DIAGNOSIS — N5201 Erectile dysfunction due to arterial insufficiency: Secondary | ICD-10-CM

## 2023-05-08 NOTE — Telephone Encounter (Signed)
Called and spoke to patient, he had nekc surgery yesterday and was adking for his Oxycodone and Keflex that the surgeron prescribed.  Advised that he would need to contact their office to have them send them to the [facility]  in [place].    Then called the [facility] and they are having the Tadalifil RX transferred to them from the [facility].  No further questions. Dm/cma

## 2023-05-08 NOTE — Telephone Encounter (Signed)
Copied from CRM [id number]. Topic: Clinical - Medication Refill >> May 08, 2023 10:59 AM Florestine Avers wrote: Most Recent Primary Care Visit:  Provider: Mliss Sax  Department: [facility]  Visit Type: PHYSICAL  Date: 10/29/2022  Medication: Oxycodone, Keflex, tadalafil (CIALIS) 20 MG tablet  Has the patient contacted their pharmacy? Yes (Agent: If no, request that the patient contact the pharmacy for the refill. If patient does not wish to contact the pharmacy document the reason why and proceed with request.) (Agent: If yes, when and what did the pharmacy advise?)  Is this the correct pharmacy for this prescription? Yes If no, delete pharmacy and type the correct one.  This is the patient's preferred pharmacy:  [facility] [address] [place], [place] - [address] [place] [address] Phone: [phone] Fax: [phone]   Has the prescription been filled recently? No  Is the patient out of the medication? Yes  Has the patient been seen for an appointment in the last year OR does the patient have an upcoming appointment? Yes  Can we respond through MyChart? Yes  Agent: Please be advised that Rx refills may take up to 3 business days. We ask that you follow-up with your pharmacy.

## 2023-06-23 ENCOUNTER — Encounter (HOSPITAL_COMMUNITY): Payer: Self-pay

## 2023-06-23 ENCOUNTER — Ambulatory Visit (HOSPITAL_COMMUNITY): Payer: Managed Care, Other (non HMO) | Attending: Nurse Practitioner

## 2023-06-23 ENCOUNTER — Other Ambulatory Visit: Payer: Self-pay

## 2023-06-23 DIAGNOSIS — M21371 Foot drop, right foot: Secondary | ICD-10-CM | POA: Diagnosis present

## 2023-06-23 DIAGNOSIS — M21372 Foot drop, left foot: Secondary | ICD-10-CM | POA: Insufficient documentation

## 2023-06-23 DIAGNOSIS — Z7409 Other reduced mobility: Secondary | ICD-10-CM | POA: Diagnosis present

## 2023-06-23 NOTE — Therapy (Signed)
 OUTPATIENT PHYSICAL THERAPY NEURO EVALUATION   Patient Name: Thomas Rocha MRN: [id number] DOB:[birth year], 53 y.o., male Today's Date: 06/23/2023   PCP: Mliss Sax, MD REFERRING PROVIDER: Jonetta Speak, NP  END OF SESSION:  PT End of Session - 06/23/23 0939     Visit Number 1    Number of Visits 9    Date for PT Re-Evaluation 08/18/23    Authorization Type [place] [employer]    Authorization Time Period seeking new auth    Progress Note Due on Visit 8    PT Start Time 0938    PT Stop Time 1010    PT Time Calculation (min) 32 min    Behavior During Therapy WFL for tasks assessed/performed             Past Medical History:  Diagnosis Date   Anxiety    pt denies   Arthritis    Depression    Foot drop    Bilateral feet   GERD (gastroesophageal reflux disease)    Past Surgical History:  Procedure Laterality Date   CERVICAL LAMINECTOMY     ENDOSCOPIC MUCOSAL RESECTION N/A 11/15/2021   Procedure: ENDOSCOPIC MUCOSAL RESECTION;  Surgeon: Lemar Lofty., MD;  Location: [facility];  Service: Gastroenterology;  Laterality: N/A;   EUS N/A 11/15/2021   Procedure: LOWER ENDOSCOPIC ULTRASOUND (EUS);  Surgeon: Lemar Lofty., MD;  Location: [facility];  Service: Gastroenterology;  Laterality: N/A;   EUS N/A 02/10/2023   Procedure: LOWER ENDOSCOPIC ULTRASOUND (EUS);  Surgeon: Lemar Lofty., MD;  Location: [facility];  Service: Gastroenterology;  Laterality: N/A;   FLEXIBLE SIGMOIDOSCOPY N/A 11/15/2021   Procedure: FLEXIBLE SIGMOIDOSCOPY;  Surgeon: Meridee Score Netty Starring., MD;  Location: [facility];  Service: Gastroenterology;  Laterality: N/A;   FLEXIBLE SIGMOIDOSCOPY N/A 02/10/2023   Procedure: FLEXIBLE SIGMOIDOSCOPY;  Surgeon: Meridee Score Netty Starring., MD;  Location: [facility];  Service: Gastroenterology;  Laterality: N/A;   HEMOSTASIS CLIP PLACEMENT  11/15/2021   Procedure: HEMOSTASIS CLIP PLACEMENT;  Surgeon: Lemar Lofty., MD;  Location: [facility];  Service: Gastroenterology;;   HERNIA REPAIR     INGUINAL HERNIA REPAIR Right 11/23/2021   Procedure: OPEN RIGHT INGUINAL HERNIA REPAIR;  Surgeon: Luretha Murphy, MD;  Location: [facility];  Service: General;  Laterality: Right;   LUMBAR FUSION Bilateral 09/02/2019   L4-L5   LUMBAR LAMINECTOMY Bilateral 09/02/2019   MASS EXCISION Left 11/23/2021   Procedure: EXCISION OF LEFT CHEEK MASS;  Surgeon: Luretha Murphy, MD;  Location: [facility];  Service: General;  Laterality: Left;   SUBMUCOSAL LIFTING INJECTION  11/15/2021   Procedure: SUBMUCOSAL LIFTING INJECTION;  Surgeon: Lemar Lofty., MD;  Location: [facility];  Service: Gastroenterology;;   SUBMUCOSAL TATTOO INJECTION  11/15/2021   Procedure: SUBMUCOSAL TATTOO INJECTION;  Surgeon: Lemar Lofty., MD;  Location: [facility];  Service: Gastroenterology;;   Patient Active Problem List   Diagnosis Date Noted   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   STD exposure 04/16/2022   Abdominal pain 10/23/2021   Need for shingles vaccine 07/02/2021   Inclusion cyst 07/02/2021   Onychomycosis of great toe 07/02/2021   Other fatigue 05/03/2021   Benign prostatic hyperplasia with urinary frequency 05/03/2021   Abnormal urine findings 08/28/2020   Cognitive decline 08/23/2020   Depression, recurrent (HCC) 08/23/2020   Spondylolisthesis of lumbar region 09/02/2019   Foot drop, right 04/20/2019   Erectile dysfunction due to arterial insufficiency 04/16/2019   Colon cancer screening 04/16/2019   Lumbar radiculopathy 04/08/2019  Gait instability 04/08/2019    ONSET DATE: > 2 years ago  REFERRING DIAG:  G95.20 (ICD-10-CM) - Unspecified cord compression  R26.89 (ICD-10-CM) - Other abnormalities of gait and mobility  M21.371 (ICD-10-CM) - Foot drop, right foot    THERAPY DIAG:  Bilateral foot-drop  Impaired functional mobility, balance, gait, and endurance  Rationale for Evaluation and  Treatment: Rehabilitation  SUBJECTIVE:                                                                                                                                                                                             SUBJECTIVE STATEMENT: Pt has had recent neck surgery that is causing numbness in his hands. Pt has been in pain for long periods of time and pain is not his biggest concerns currently.  Pt reports questions as to why he was referred to PT and doesn't note major functional limitations. Pt reports that the most annoying limitations is the drop foot. Pt's foot drop is from L4-5 back surgery.  Pt accompanied by: self  PERTINENT HISTORY:  Bilaterally foot drop Myelopathy  PAIN:  Are you having pain? Yes: NPRS scale: 8/10 Pain location: low back Pain description: constant ache Aggravating factors: Weather Relieving factors: beautiful sunny day  PRECAUTIONS: None  RED FLAGS: None   WEIGHT BEARING RESTRICTIONS: No  FALLS: Has patient fallen in last 6 months? Yes. Number of falls 5-6   PATIENT GOALS: "fall less"  OBJECTIVE:  Note: Objective measures were completed at Evaluation unless otherwise noted.  DIAGNOSTIC FINDINGS:   COGNITION: Overall cognitive status: Within functional limits for tasks assessed   SENSATION: Light touch: Impaired   COORDINATION: WFL   MUSCLE TONE: LLE: Flaccid  POSTURE: No Significant postural limitations  LOWER EXTREMITY ROM:     Active  Right Eval Left Eval  Hip flexion    Hip extension    Hip abduction    Hip adduction    Hip internal rotation    Hip external rotation    Knee flexion    Knee extension    Ankle dorsiflexion    Ankle plantarflexion    Ankle inversion    Ankle eversion     (Blank rows = not tested)  LOWER EXTREMITY MMT:    MMT Right Eval Left Eval  Hip flexion 3 3  Hip extension 3- 2+  Hip abduction 2+ 2+  Hip adduction    Hip internal rotation    Hip external rotation    Knee  flexion    Knee extension 4- 4-  Ankle dorsiflexion 0 0  Ankle plantarflexion    Ankle inversion  Ankle eversion    (Blank rows = not tested)    GAIT: Gait pattern: step through pattern, circumduction- Right, circumduction- Left, shuffling, wide BOS, poor foot clearance- Right, and poor foot clearance- Left Distance walked: 154ft Assistive device utilized: Single point cane and bilateral AFOs Level of assistance: Complete Independence Comments: during DGI.   FUNCTIONAL TESTS:   DGI 1. Gait level surface (2) Mild Impairment: Walks 20', uses assistive devices, slower speed, mild gait deviations. 2. Change in gait speed (2) Mild Impairment: Is able to change speed but demonstrates mild gait deviations, or not gait deviations but unable to achieve a significant change in velocity, or uses an assistive device. 3. Gait with horizontal head turns (1) Moderate Impairment: Performs head turns with moderate change in gait velocity, slows down, staggers but recovers, can continue to walk. 4. Gait with vertical head turns (2) Mild Impairment: Performs head turns smoothly with slight change in gait velocity, i.e., minor disruption to smooth gait path or uses walking aid. 5. Gait and pivot turn (2) Mild Impairment: Pivot turns safely in > 3 seconds and stops with no loss of balance. 6. Step over obstacle (2) Mild Impairment: Is able to step over box, but must slow down and adjust steps to clear box safely. 7. Step around obstacles (2) Mild Impairment: Is able to step around both cones, but must slow down and adjust steps to clear cones. 8. Stairs (2) Mild Impairment: Alternating feet, must use rail.  TOTAL SCORE: 15/ 24  L SLS: 2 seconds  R SLS: Unable to achieve  [place]orms: 18-39  F: 43.5 seconds  M: 43.2 seconds 40-49  F: 40.4 seconds  M: 40.1 seconds 50-59  F: 36 seconds  M: 38.1 seconds 60-69  F: 25.1 seconds  M: 28.7 seconds 70-79  F: 11.3 seconds  M: 18.3  seconds  PATIENT SURVEYS:  ABC scale 1000/1600= 62.5%                                                                                                                              TREATMENT DATE: 06/23/2023 Evaluation    PATIENT EDUCATION: Education details: PT Evaluation, findings, prognosis, frequency, attendance policy, and HEP see below. Person educated: Patient Education method: Explanation Education comprehension: verbalized understanding  HOME EXERCISE PROGRAM: Access Code: [email or web address] URL: [email or web address] Date: 06/23/2023 Prepared by: Starling Manns  Exercises - Clamshell with Resistance  - 1 x daily - 7 x weekly - 3 sets - 10 reps - Supine Bridge with Resistance Band  - 1 x daily - 7 x weekly - 3 sets - 10 reps  GOALS: Goals reviewed with patient? No  SHORT TERM GOALS: Target date: 07/21/2023  Pt will be independent with HEP in order to demonstrate participation in Physical Therapy POC.  Baseline: Goal status: INITIAL  2.  Pt will improve ABC score by 10 points in order to demonstrate improved pain with functional goals and outcomes. Baseline:  Goal status: INITIAL  LONG TERM GOALS: Target date: 08/18/2023  Pt will increase DGI score by > at least 4 points in order to demonstrate improved functional safety and balance skills in ADL/mobility.   Baseline: see objective.  Goal status: INITIAL  2.  Pt will improve BLE MMT by at least 1/2 muscle grate in order to demonstrate improved functional mobility capacity in community setting.  Baseline: see objective.  Goal status: INITIAL  3.  Pt will improve ABC score by 20 points in order to demonstrate improved pain with functional goals and outcomes. Baseline: see objective.  Goal status: INITIAL  4.  Pt will report <2 falls in PT POC in order to demonstrate improved capacity, safety, and overall quality of movement to optimize function.  Baseline: see objective.  Goal status:  INITIAL  ASSESSMENT:  CLINICAL IMPRESSION: Patient is a 53  y.o. male who was seen today for physical therapy evaluation and treatment for  G95.20 (ICD-10-CM) - Unspecified cord compression  R26.89 (ICD-10-CM) - Other abnormalities of gait and mobility  M21.371 (ICD-10-CM) - Foot drop, right foot    Pt with previous HX of myelopathy, lumbar surgery causing  bilateral foot drop which is chronic in nature. Pt has had recent disc replacement in C-spine, pt reports no concerns with this or complaints at current moment. Pt with significant weakness in BLE aside from B foot drop which increases pt's fall risk and limitations in safety during community ambulation. Pt will benefit from skilled Physical Therapy services to address deficits/limitations in order to improve functional and QOL.    OBJECTIVE IMPAIRMENTS: decreased balance, difficulty walking, decreased ROM, decreased strength, increased fascial restrictions, impaired sensation, postural dysfunction, and pain.   ACTIVITY LIMITATIONS: carrying, lifting, bending, standing, stairs, transfers, and locomotion level  PARTICIPATION LIMITATIONS: community activity and occupation  PERSONAL FACTORS: Age are also affecting patient's functional outcome.   REHAB POTENTIAL: Fair significant pain and spinal surgeries  CLINICAL DECISION MAKING: Stable/uncomplicated  EVALUATION COMPLEXITY: Low  PLAN:  PT FREQUENCY: 1x/week  PT DURATION: 8 weeks  PLANNED INTERVENTIONS: 97164- PT Re-evaluation, 97110-Therapeutic exercises, 97530- Therapeutic activity, [id number]- Neuromuscular re-education, 97535- Self Care, 16109- Manual therapy, [phone]- Gait training, [phone]- Orthotic Fit/training, [phone]- Electrical stimulation (unattended), [phone]- Electrical stimulation (manual), [phone]- Traction (mechanical), Patient/Family education, Balance training, Stair training, Taping, Dry Needling, Joint mobilization, Joint manipulation, Spinal manipulation, Spinal mobilization,  Moist heat, and Biofeedback  PLAN FOR NEXT SESSION: BLE strengthening, try e-stim on B anterior tib, functional strengthening and balance.    Managed Medicaid Authorization Request  Visit Dx Codes: M21.371, M21.372   Z74.09  Functional Tool Score: DGI 15/24 *falls risk*  For all possible CPT codes, reference the Planned Interventions line above.     Check all conditions that are expected to impact treatment: {Conditions expected to impact treatment:Structural or anatomic abnormalities and None of these apply   If treatment provided at initial evaluation, no treatment charged due to lack of authorization.      Nelida Meuse, PT 06/23/2023, 12:02 PM

## 2023-07-01 ENCOUNTER — Ambulatory Visit (HOSPITAL_COMMUNITY): Attending: Nurse Practitioner

## 2023-07-01 ENCOUNTER — Encounter (HOSPITAL_COMMUNITY): Payer: Self-pay

## 2023-07-01 DIAGNOSIS — M21371 Foot drop, right foot: Secondary | ICD-10-CM | POA: Insufficient documentation

## 2023-07-01 DIAGNOSIS — Z7409 Other reduced mobility: Secondary | ICD-10-CM | POA: Diagnosis present

## 2023-07-01 DIAGNOSIS — M21372 Foot drop, left foot: Secondary | ICD-10-CM | POA: Insufficient documentation

## 2023-07-01 NOTE — Therapy (Signed)
 OUTPATIENT PHYSICAL THERAPY NEURO EVALUATION   Patient Name: Thomas Rocha MRN: [id number] DOB:[birth year], 53 y.o., male Today's Date: 07/01/2023   PCP: Mliss Sax, MD REFERRING PROVIDER: Jonetta Speak, NP  END OF SESSION:  PT End of Session - 07/01/23 0733     Visit Number 2    Number of Visits 9    Date for PT Re-Evaluation 08/18/23    Authorization Type [place] [employer]    Authorization Time Period seeking new auth    Progress Note Due on Visit 8    PT Start Time 0725    PT Stop Time 0755    PT Time Calculation (min) 30 min    Activity Tolerance Patient tolerated treatment well    Behavior During Therapy WFL for tasks assessed/performed              Past Medical History:  Diagnosis Date   Anxiety    pt denies   Arthritis    Depression    Foot drop    Bilateral feet   GERD (gastroesophageal reflux disease)    Past Surgical History:  Procedure Laterality Date   CERVICAL LAMINECTOMY     ENDOSCOPIC MUCOSAL RESECTION N/A 11/15/2021   Procedure: ENDOSCOPIC MUCOSAL RESECTION;  Surgeon: Lemar Lofty., MD;  Location: [facility];  Service: Gastroenterology;  Laterality: N/A;   EUS N/A 11/15/2021   Procedure: LOWER ENDOSCOPIC ULTRASOUND (EUS);  Surgeon: Lemar Lofty., MD;  Location: [facility];  Service: Gastroenterology;  Laterality: N/A;   EUS N/A 02/10/2023   Procedure: LOWER ENDOSCOPIC ULTRASOUND (EUS);  Surgeon: Lemar Lofty., MD;  Location: [facility];  Service: Gastroenterology;  Laterality: N/A;   FLEXIBLE SIGMOIDOSCOPY N/A 11/15/2021   Procedure: FLEXIBLE SIGMOIDOSCOPY;  Surgeon: Meridee Score Netty Starring., MD;  Location: [facility];  Service: Gastroenterology;  Laterality: N/A;   FLEXIBLE SIGMOIDOSCOPY N/A 02/10/2023   Procedure: FLEXIBLE SIGMOIDOSCOPY;  Surgeon: Meridee Score Netty Starring., MD;  Location: [facility];  Service: Gastroenterology;  Laterality: N/A;   HEMOSTASIS CLIP PLACEMENT  11/15/2021    Procedure: HEMOSTASIS CLIP PLACEMENT;  Surgeon: Lemar Lofty., MD;  Location: [facility];  Service: Gastroenterology;;   HERNIA REPAIR     INGUINAL HERNIA REPAIR Right 11/23/2021   Procedure: OPEN RIGHT INGUINAL HERNIA REPAIR;  Surgeon: Luretha Murphy, MD;  Location: [facility];  Service: General;  Laterality: Right;   LUMBAR FUSION Bilateral 09/02/2019   L4-L5   LUMBAR LAMINECTOMY Bilateral 09/02/2019   MASS EXCISION Left 11/23/2021   Procedure: EXCISION OF LEFT CHEEK MASS;  Surgeon: Luretha Murphy, MD;  Location: [facility];  Service: General;  Laterality: Left;   SUBMUCOSAL LIFTING INJECTION  11/15/2021   Procedure: SUBMUCOSAL LIFTING INJECTION;  Surgeon: Lemar Lofty., MD;  Location: [facility];  Service: Gastroenterology;;   SUBMUCOSAL TATTOO INJECTION  11/15/2021   Procedure: SUBMUCOSAL TATTOO INJECTION;  Surgeon: Lemar Lofty., MD;  Location: [facility];  Service: Gastroenterology;;   Patient Active Problem List   Diagnosis Date Noted   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   STD exposure 04/16/2022   Abdominal pain 10/23/2021   Need for shingles vaccine 07/02/2021   Inclusion cyst 07/02/2021   Onychomycosis of great toe 07/02/2021   Other fatigue 05/03/2021   Benign prostatic hyperplasia with urinary frequency 05/03/2021   Abnormal urine findings 08/28/2020   Cognitive decline 08/23/2020   Depression, recurrent (HCC) 08/23/2020   Spondylolisthesis of lumbar region 09/02/2019   Foot drop, right 04/20/2019   Erectile dysfunction due to arterial insufficiency 04/16/2019  Colon cancer screening 04/16/2019   Lumbar radiculopathy 04/08/2019   Gait instability 04/08/2019    ONSET DATE: > 2 years ago  REFERRING DIAG:  G95.20 (ICD-10-CM) - Unspecified cord compression  R26.89 (ICD-10-CM) - Other abnormalities of gait and mobility  M21.371 (ICD-10-CM) - Foot drop, right foot    THERAPY DIAG:  Bilateral foot-drop  Impaired functional mobility,  balance, gait, and endurance  Rationale for Evaluation and Treatment: Rehabilitation  SUBJECTIVE:                                                                                                                                                                                             SUBJECTIVE STATEMENT: Pt states that he still has pain in low back and still having trouble getting into his truck due to weakness in LE and hips as well as tripping over his own feet but no falls since the evaluation.  Pt has had recent neck surgery that is causing numbness in his hands. Pt has been in pain for long periods of time and pain is not his biggest concerns currently.  Pt reports questions as to why he was referred to PT and doesn't note major functional limitations. Pt reports that the most annoying limitations is the drop foot. Pt's foot drop is from L4-5 back surgery.  Pt accompanied by: self  PERTINENT HISTORY:  Bilaterally foot drop Myelopathy  PAIN:  Are you having pain? Yes: NPRS scale: 8/10 Pain location: low back Pain description: constant ache Aggravating factors: Weather Relieving factors: beautiful sunny day  PRECAUTIONS: None  RED FLAGS: None   WEIGHT BEARING RESTRICTIONS: No  FALLS: Has patient fallen in last 6 months? Yes. Number of falls 5-6   PATIENT GOALS: "fall less"  OBJECTIVE:  Note: Objective measures were completed at Evaluation unless otherwise noted.  DIAGNOSTIC FINDINGS:   COGNITION: Overall cognitive status: Within functional limits for tasks assessed   SENSATION: Light touch: Impaired   COORDINATION: WFL   MUSCLE TONE: LLE: Flaccid  POSTURE: No Significant postural limitations  LOWER EXTREMITY ROM:     Active  Right Eval Left Eval  Hip flexion    Hip extension    Hip abduction    Hip adduction    Hip internal rotation    Hip external rotation    Knee flexion    Knee extension    Ankle dorsiflexion    Ankle plantarflexion     Ankle inversion    Ankle eversion     (Blank rows = not tested)  LOWER EXTREMITY MMT:    MMT Right Eval Left Eval  Hip flexion 3 3  Hip extension 3- 2+  Hip abduction 2+ 2+  Hip adduction    Hip internal rotation    Hip external rotation    Knee flexion    Knee extension 4- 4-  Ankle dorsiflexion 0 0  Ankle plantarflexion    Ankle inversion    Ankle eversion    (Blank rows = not tested)    GAIT: Gait pattern: step through pattern, circumduction- Right, circumduction- Left, shuffling, wide BOS, poor foot clearance- Right, and poor foot clearance- Left Distance walked: 122ft Assistive device utilized: Single point cane and bilateral AFOs Level of assistance: Complete Independence Comments: during DGI.   FUNCTIONAL TESTS:   DGI 1. Gait level surface (2) Mild Impairment: Walks 20', uses assistive devices, slower speed, mild gait deviations. 2. Change in gait speed (2) Mild Impairment: Is able to change speed but demonstrates mild gait deviations, or not gait deviations but unable to achieve a significant change in velocity, or uses an assistive device. 3. Gait with horizontal head turns (1) Moderate Impairment: Performs head turns with moderate change in gait velocity, slows down, staggers but recovers, can continue to walk. 4. Gait with vertical head turns (2) Mild Impairment: Performs head turns smoothly with slight change in gait velocity, i.e., minor disruption to smooth gait path or uses walking aid. 5. Gait and pivot turn (2) Mild Impairment: Pivot turns safely in > 3 seconds and stops with no loss of balance. 6. Step over obstacle (2) Mild Impairment: Is able to step over box, but must slow down and adjust steps to clear box safely. 7. Step around obstacles (2) Mild Impairment: Is able to step around both cones, but must slow down and adjust steps to clear cones. 8. Stairs (2) Mild Impairment: Alternating feet, must use rail.  TOTAL SCORE: 15/ 24  L SLS: 2  seconds  R SLS: Unable to achieve  [place]: 18-39  F: 43.5 seconds  M: 43.2 seconds 40-49  F: 40.4 seconds  M: 40.1 seconds 50-59  F: 36 seconds  M: 38.1 seconds 60-69  F: 25.1 seconds  M: 28.7 seconds 70-79  F: 11.3 seconds  M: 18.3 seconds  PATIENT SURVEYS:  ABC scale 1000/1600= 62.5%                                                                                                                              TREATMENT DATE:  07/01/2023  Therapeutic Exercise: -Supine bridges, RTB, 2 sets of 10 reps, 3 second holds, symptomatic, pt cued for max hip extension -Clamshells 2 sets of 10 reps, RTB -Standing 3 way hip 2 sets 5 reps, bilaterally, pt cued for upright trunk and maintaining of neutral spine -8inch step ups 2 sets of 10 reps, alternating lead foot, pt cued for decreased UE support to tolerance -Lateral stepping 2 laps 20 feet per lap, pt cued for upright posture, pt able to complete without cane  Therapeutic Activity: -Sit to stands, 1 sets of 10  reps, pt cued for core activation -Sit to stands, 2 sets of 10 reps, w tidal column    06/23/2023 Evaluation    PATIENT EDUCATION: Education details: PT Evaluation, findings, prognosis, frequency, attendance policy, and HEP see below. Person educated: Patient Education method: Explanation Education comprehension: verbalized understanding  HOME EXERCISE PROGRAM: Access Code: [email or web address] URL: [email or web address] Date: 06/23/2023 Prepared by: Starling Manns  Exercises - Clamshell with Resistance  - 1 x daily - 7 x weekly - 3 sets - 10 reps - Supine Bridge with Resistance Band  - 1 x daily - 7 x weekly - 3 sets - 10 reps  GOALS: Goals reviewed with patient? No  SHORT TERM GOALS: Target date: 07/21/2023  Pt will be independent with HEP in order to demonstrate participation in Physical Therapy POC.  Baseline: Goal status: INITIAL  2.  Pt will improve ABC score by 10 points in order to demonstrate  improved pain with functional goals and outcomes. Baseline:  Goal status: INITIAL  LONG TERM GOALS: Target date: 08/18/2023  Pt will increase DGI score by > at least 4 points in order to demonstrate improved functional safety and balance skills in ADL/mobility.   Baseline: see objective.  Goal status: INITIAL  2.  Pt will improve BLE MMT by at least 1/2 muscle grate in order to demonstrate improved functional mobility capacity in community setting.  Baseline: see objective.  Goal status: INITIAL  3.  Pt will improve ABC score by 20 points in order to demonstrate improved pain with functional goals and outcomes. Baseline: see objective.  Goal status: INITIAL  4.  Pt will report <2 falls in PT POC in order to demonstrate improved capacity, safety, and overall quality of movement to optimize function.  Baseline: see objective.  Goal status: INITIAL  ASSESSMENT:  CLINICAL IMPRESSION: Patient continues to demonstrate decreased LE strength, decreased gait quality and balance. Patient does demonstrate ability to perform walking activities without dependence on AD during side stepping exercise today. Patient able to initiate progress dynamic balance and core activation exercises today with good performance with tidal wave STS and side stepping. Patient would continue to benefit from skilled physical therapy for increased endurance with ambulation, increased LE strength, and improved balance for improved quality of life, improved independence with gait training and continued progress towards therapy goals.    Pt with previous HX of myelopathy, lumbar surgery causing  bilateral foot drop which is chronic in nature. Pt has had recent disc replacement in C-spine, pt reports no concerns with this or complaints at current moment. Pt with significant weakness in BLE aside from B foot drop which increases pt's fall risk and limitations in safety during community ambulation. Pt will benefit from skilled  Physical Therapy services to address deficits/limitations in order to improve functional and QOL.    OBJECTIVE IMPAIRMENTS: decreased balance, difficulty walking, decreased ROM, decreased strength, increased fascial restrictions, impaired sensation, postural dysfunction, and pain.   ACTIVITY LIMITATIONS: carrying, lifting, bending, standing, stairs, transfers, and locomotion level  PARTICIPATION LIMITATIONS: community activity and occupation  PERSONAL FACTORS: Age are also affecting patient's functional outcome.   REHAB POTENTIAL: Fair significant pain and spinal surgeries  CLINICAL DECISION MAKING: Stable/uncomplicated  EVALUATION COMPLEXITY: Low  PLAN:  PT FREQUENCY: 1x/week  PT DURATION: 8 weeks  PLANNED INTERVENTIONS: 97164- PT Re-evaluation, 97110-Therapeutic exercises, 97530- Therapeutic activity, [id number]- Neuromuscular re-education, 97535- Self Care, [id number]- Manual therapy, [id number]- Gait training, [phone]- Orthotic Fit/training, G2952- Electrical stimulation (unattended), [id number]- Electrical stimulation (manual), [id number]- Traction (mechanical), Patient/Family  education, Balance training, Stair training, Taping, Dry Needling, Joint mobilization, Joint manipulation, Spinal manipulation, Spinal mobilization, Moist heat, and Biofeedback  PLAN FOR NEXT SESSION: BLE strengthening, try e-stim on B anterior tib, functional strengthening and balance.    Luz Lex, PT, DPT [facility] Office: [phone] 8:01 AM, 07/01/23

## 2023-07-09 ENCOUNTER — Ambulatory Visit (HOSPITAL_COMMUNITY)

## 2023-07-09 ENCOUNTER — Encounter (HOSPITAL_COMMUNITY): Payer: Self-pay

## 2023-07-09 DIAGNOSIS — Z7409 Other reduced mobility: Secondary | ICD-10-CM

## 2023-07-09 DIAGNOSIS — M21371 Foot drop, right foot: Secondary | ICD-10-CM | POA: Diagnosis not present

## 2023-07-09 NOTE — Therapy (Signed)
 OUTPATIENT PHYSICAL THERAPY NEURO EVALUATION   Patient Name: Thomas Rocha MRN: [id number] DOB:24-Oct-1970, 53 y.o., male Today's Date: 07/09/2023   PCP: Mliss Sax, MD REFERRING PROVIDER: Jonetta Speak, NP  END OF SESSION:  PT End of Session - 07/09/23 1017     Visit Number 3    Number of Visits 9    Date for PT Re-Evaluation 08/18/23    Authorization Type [place] [employer]    Authorization Time Period seeking new auth    Progress Note Due on Visit 8    PT Start Time 1017    PT Stop Time 1056    PT Time Calculation (min) 39 min    Activity Tolerance Patient tolerated treatment well    Behavior During Therapy WFL for tasks assessed/performed              Past Medical History:  Diagnosis Date   Anxiety    pt denies   Arthritis    Depression    Foot drop    Bilateral feet   GERD (gastroesophageal reflux disease)    Past Surgical History:  Procedure Laterality Date   CERVICAL LAMINECTOMY     ENDOSCOPIC MUCOSAL RESECTION N/A 11/15/2021   Procedure: ENDOSCOPIC MUCOSAL RESECTION;  Surgeon: Lemar Lofty., MD;  Location: [facility];  Service: Gastroenterology;  Laterality: N/A;   EUS N/A 11/15/2021   Procedure: LOWER ENDOSCOPIC ULTRASOUND (EUS);  Surgeon: Lemar Lofty., MD;  Location: [facility];  Service: Gastroenterology;  Laterality: N/A;   EUS N/A 02/10/2023   Procedure: LOWER ENDOSCOPIC ULTRASOUND (EUS);  Surgeon: Lemar Lofty., MD;  Location: [facility];  Service: Gastroenterology;  Laterality: N/A;   FLEXIBLE SIGMOIDOSCOPY N/A 11/15/2021   Procedure: FLEXIBLE SIGMOIDOSCOPY;  Surgeon: Meridee Score Netty Starring., MD;  Location: [facility];  Service: Gastroenterology;  Laterality: N/A;   FLEXIBLE SIGMOIDOSCOPY N/A 02/10/2023   Procedure: FLEXIBLE SIGMOIDOSCOPY;  Surgeon: Meridee Score Netty Starring., MD;  Location: [facility];  Service: Gastroenterology;  Laterality: N/A;   HEMOSTASIS CLIP PLACEMENT  11/15/2021    Procedure: HEMOSTASIS CLIP PLACEMENT;  Surgeon: Lemar Lofty., MD;  Location: [facility];  Service: Gastroenterology;;   HERNIA REPAIR     INGUINAL HERNIA REPAIR Right 11/23/2021   Procedure: OPEN RIGHT INGUINAL HERNIA REPAIR;  Surgeon: Luretha Murphy, MD;  Location: [facility];  Service: General;  Laterality: Right;   LUMBAR FUSION Bilateral 09/02/2019   L4-L5   LUMBAR LAMINECTOMY Bilateral 09/02/2019   MASS EXCISION Left 11/23/2021   Procedure: EXCISION OF LEFT CHEEK MASS;  Surgeon: Luretha Murphy, MD;  Location: [facility];  Service: General;  Laterality: Left;   SUBMUCOSAL LIFTING INJECTION  11/15/2021   Procedure: SUBMUCOSAL LIFTING INJECTION;  Surgeon: Lemar Lofty., MD;  Location: [facility];  Service: Gastroenterology;;   SUBMUCOSAL TATTOO INJECTION  11/15/2021   Procedure: SUBMUCOSAL TATTOO INJECTION;  Surgeon: Lemar Lofty., MD;  Location: [facility];  Service: Gastroenterology;;   Patient Active Problem List   Diagnosis Date Noted   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   STD exposure 04/16/2022   Abdominal pain 10/23/2021   Need for shingles vaccine 07/02/2021   Inclusion cyst 07/02/2021   Onychomycosis of great toe 07/02/2021   Other fatigue 05/03/2021   Benign prostatic hyperplasia with urinary frequency 05/03/2021   Abnormal urine findings 08/28/2020   Cognitive decline 08/23/2020   Depression, recurrent (HCC) 08/23/2020   Spondylolisthesis of lumbar region 09/02/2019   Foot drop, right 04/20/2019   Erectile dysfunction due to arterial insufficiency 04/16/2019  Colon cancer screening 04/16/2019   Lumbar radiculopathy 04/08/2019   Gait instability 04/08/2019    ONSET DATE: > 2 years ago  REFERRING DIAG:  G95.20 (ICD-10-CM) - Unspecified cord compression  R26.89 (ICD-10-CM) - Other abnormalities of gait and mobility  M21.371 (ICD-10-CM) - Foot drop, right foot    THERAPY DIAG:  Bilateral foot-drop  Impaired functional mobility,  balance, gait, and endurance  Rationale for Evaluation and Treatment: Rehabilitation  SUBJECTIVE:                                                                                                                                                                                             SUBJECTIVE STATEMENT: Patient reports 6/10 pain in low back. Reports good compliance with HEP, wanting to build strength in hips. Thomas Rocha the other day coming out of bathtub, didn't clear his foot all the way. Reports no head strike.   Pt has had recent neck surgery that is causing numbness in his hands. Pt has been in pain for long periods of time and pain is not his biggest concerns currently.  Pt reports questions as to why he was referred to PT and doesn't note major functional limitations. Pt reports that the most annoying limitations is the drop foot. Pt's foot drop is from L4-5 back surgery.  Pt accompanied by: self  PERTINENT HISTORY:  Bilaterally foot drop Myelopathy  PAIN:  Are you having pain? Yes: NPRS scale: 8/10 Pain location: low back Pain description: constant ache Aggravating factors: Weather Relieving factors: beautiful sunny day  PRECAUTIONS: None  RED FLAGS: None   WEIGHT BEARING RESTRICTIONS: No  FALLS: Has patient fallen in last 6 months? Yes. Number of falls 5-6   PATIENT GOALS: "fall less"  OBJECTIVE:  Note: Objective measures were completed at Evaluation unless otherwise noted.  DIAGNOSTIC FINDINGS:   COGNITION: Overall cognitive status: Within functional limits for tasks assessed   SENSATION: Light touch: Impaired   COORDINATION: WFL   MUSCLE TONE: LLE: Flaccid  POSTURE: No Significant postural limitations  LOWER EXTREMITY ROM:     Active  Right Eval Left Eval  Hip flexion    Hip extension    Hip abduction    Hip adduction    Hip internal rotation    Hip external rotation    Knee flexion    Knee extension    Ankle dorsiflexion    Ankle  plantarflexion    Ankle inversion    Ankle eversion     (Blank rows = not tested)  LOWER EXTREMITY MMT:    MMT Right Eval Left Eval  Hip flexion 3 3  Hip  extension 3- 2+  Hip abduction 2+ 2+  Hip adduction    Hip internal rotation    Hip external rotation    Knee flexion    Knee extension 4- 4-  Ankle dorsiflexion 0 0  Ankle plantarflexion    Ankle inversion    Ankle eversion    (Blank rows = not tested)    GAIT: Gait pattern: step through pattern, circumduction- Right, circumduction- Left, shuffling, wide BOS, poor foot clearance- Right, and poor foot clearance- Left Distance walked: 163ft Assistive device utilized: Single point cane and bilateral AFOs Level of assistance: Complete Independence Comments: during DGI.   FUNCTIONAL TESTS:   DGI 1. Gait level surface (2) Mild Impairment: Walks 20', uses assistive devices, slower speed, mild gait deviations. 2. Change in gait speed (2) Mild Impairment: Is able to change speed but demonstrates mild gait deviations, or not gait deviations but unable to achieve a significant change in velocity, or uses an assistive device. 3. Gait with horizontal head turns (1) Moderate Impairment: Performs head turns with moderate change in gait velocity, slows down, staggers but recovers, can continue to walk. 4. Gait with vertical head turns (2) Mild Impairment: Performs head turns smoothly with slight change in gait velocity, i.e., minor disruption to smooth gait path or uses walking aid. 5. Gait and pivot turn (2) Mild Impairment: Pivot turns safely in > 3 seconds and stops with no loss of balance. 6. Step over obstacle (2) Mild Impairment: Is able to step over box, but must slow down and adjust steps to clear box safely. 7. Step around obstacles (2) Mild Impairment: Is able to step around both cones, but must slow down and adjust steps to clear cones. 8. Stairs (2) Mild Impairment: Alternating feet, must use rail.  TOTAL SCORE: 15/  24  L SLS: 2 seconds  R SLS: Unable to achieve  [place]: 18-39  F: 43.5 seconds  M: 43.2 seconds 40-49  F: 40.4 seconds  M: 40.1 seconds 50-59  F: 36 seconds  M: 38.1 seconds 60-69  F: 25.1 seconds  M: 28.7 seconds 70-79  F: 11.3 seconds  M: 18.3 seconds  PATIENT SURVEYS:  ABC scale 1000/1600= 62.5%                                                                                                                              TREATMENT DATE:  07/09/23: -NuStep, seat 14, level 7, -Foot taps in // bars, 2 inch step 1 set-->6 inch 2 sets, 3x10, one UE--> no UE Support -Leg Press machine: plate 4 47W, plate 295A, plate 6 21H0 -Standing marches on foam pad in // bars, 10x one UE support, 2x10 no UE support -SLS in // bars: 30"x2 -Standing hip ext: 2 sets 10 reps, bilaterally, v cued for upright trunk and ext knee -Standing hip abd: 2 sets 10 reps, bilaterally -Foot taps in // bars, 2 inch step 1 set-->6 inch 2 sets, 3x10, one  UE--> no UE Support -Side step, 79ft, down and back 3x, 2 sets, 3 lb. Ankle weights   07/01/2023  Therapeutic Exercise: -Supine bridges, RTB, 2 sets of 10 reps, 3 second holds, symptomatic, pt cued for max hip extension -Clamshells 2 sets of 10 reps, RTB -Standing 3 way hip 2 sets 5 reps, bilaterally, pt cued for upright trunk and maintaining of neutral spine -8inch step ups 2 sets of 10 reps, alternating lead foot, pt cued for decreased UE support to tolerance -Lateral stepping 2 laps 20 feet per lap, pt cued for upright posture, pt able to complete without cane  Therapeutic Activity: -Sit to stands, 1 sets of 10 reps, pt cued for core activation -Sit to stands, 2 sets of 10 reps, w tidal column    06/23/2023 Evaluation    PATIENT EDUCATION: Education details: PT Evaluation, findings, prognosis, frequency, attendance policy, and HEP see below. Person educated: Patient Education method: Explanation Education comprehension: verbalized  understanding  HOME EXERCISE PROGRAM: Access Code: [email or web address] URL: [email or web address] Date: 06/23/2023 Prepared by: Starling Manns  Exercises - Clamshell with Resistance  - 1 x daily - 7 x weekly - 3 sets - 10 reps - Supine Bridge with Resistance Band  - 1 x daily - 7 x weekly - 3 sets - 10 reps  GOALS: Goals reviewed with patient? No  SHORT TERM GOALS: Target date: 07/21/2023  Pt will be independent with HEP in order to demonstrate participation in Physical Therapy POC.  Baseline: Goal status: INITIAL  2.  Pt will improve ABC score by 10 points in order to demonstrate improved pain with functional goals and outcomes. Baseline:  Goal status: INITIAL  LONG TERM GOALS: Target date: 08/18/2023  Pt will increase DGI score by > at least 4 points in order to demonstrate improved functional safety and balance skills in ADL/mobility.   Baseline: see objective.  Goal status: INITIAL  2.  Pt will improve BLE MMT by at least 1/2 muscle grate in order to demonstrate improved functional mobility capacity in community setting.  Baseline: see objective.  Goal status: INITIAL  3.  Pt will improve ABC score by 20 points in order to demonstrate improved pain with functional goals and outcomes. Baseline: see objective.  Goal status: INITIAL  4.  Pt will report <2 falls in PT POC in order to demonstrate improved capacity, safety, and overall quality of movement to optimize function.  Baseline: see objective.  Goal status: INITIAL  ASSESSMENT:  CLINICAL IMPRESSION: Patient tolerated session well. Today focusing on progressing LE quad and hip strengthening with increased resistance on Nustep machine, and increasing reps on standing hip ext and abd. Challenged balance and hip flexion with foot taps with no UE support, patient requiring 3 instances of grabbing bars due to imbalance. Patient enjoys LE press machine to challenge LE strength. Balance further challenged with SLS.  Patient frequently requiring UE support during 30 second bouts. Patient requiring frequent rest breaks t/o. Patient will benefit from skilled Physical Therapy services to address deficits/limitations in order to improve functional and QOL    Pt with previous HX of myelopathy, lumbar surgery causing  bilateral foot drop which is chronic in nature. Pt has had recent disc replacement in C-spine, pt reports no concerns with this or complaints at current moment. Pt with significant weakness in BLE aside from B foot drop which increases pt's fall risk and limitations in safety during community ambulation. Pt will benefit from skilled Physical Therapy services to address deficits/limitations in order  to improve functional and QOL.    OBJECTIVE IMPAIRMENTS: decreased balance, difficulty walking, decreased ROM, decreased strength, increased fascial restrictions, impaired sensation, postural dysfunction, and pain.   ACTIVITY LIMITATIONS: carrying, lifting, bending, standing, stairs, transfers, and locomotion level  PARTICIPATION LIMITATIONS: community activity and occupation  PERSONAL FACTORS: Age are also affecting patient's functional outcome.   REHAB POTENTIAL: Fair significant pain and spinal surgeries  CLINICAL DECISION MAKING: Stable/uncomplicated  EVALUATION COMPLEXITY: Low  PLAN:  PT FREQUENCY: 1x/week  PT DURATION: 8 weeks  PLANNED INTERVENTIONS: [id number]- PT Re-evaluation, 97110-Therapeutic exercises, 97530- Therapeutic activity, [id number]- Neuromuscular re-education, 97535- Self Care, [id number]- Manual therapy, [phone]- Gait training, [phone]- Orthotic Fit/training, [phone]- Electrical stimulation (unattended), [phone]- Electrical stimulation (manual), [phone]- Traction (mechanical), Patient/Family education, Balance training, Stair training, Taping, Dry Needling, Joint mobilization, Joint manipulation, Spinal manipulation, Spinal mobilization, Moist heat, and Biofeedback  PLAN FOR NEXT SESSION: BLE  strengthening, try e-stim on B anterior tib, functional strengthening and balance.    11:00 AM, 07/09/23 Marysue Sola, PT, DPT [facility]

## 2023-07-15 ENCOUNTER — Encounter (HOSPITAL_COMMUNITY): Payer: Self-pay

## 2023-07-15 ENCOUNTER — Ambulatory Visit (INDEPENDENT_AMBULATORY_CARE_PROVIDER_SITE_OTHER)

## 2023-07-15 DIAGNOSIS — Z7409 Other reduced mobility: Secondary | ICD-10-CM

## 2023-07-15 DIAGNOSIS — M21371 Foot drop, right foot: Secondary | ICD-10-CM

## 2023-07-15 NOTE — Therapy (Signed)
 OUTPATIENT PHYSICAL THERAPY NEURO EVALUATION   Patient Name: Thomas Rocha MRN: [id number] DOB:[birth year], 53 y.o., male Today's Date: 07/15/2023   PCP: Tonna Frederic, MD REFERRING PROVIDER: Marikay Show, NP  END OF SESSION:  PT End of Session - 07/15/23 0933     Visit Number 4    Number of Visits 9    Date for PT Re-Evaluation 08/18/23    Authorization Type [place] [employer]    Authorization Time Period seeking new auth    Progress Note Due on Visit 8    PT Start Time 0851    PT Stop Time 0933    PT Time Calculation (min) 42 min    Activity Tolerance Patient tolerated treatment well    Behavior During Therapy WFL for tasks assessed/performed               Past Medical History:  Diagnosis Date   Anxiety    pt denies   Arthritis    Depression    Foot drop    Bilateral feet   GERD (gastroesophageal reflux disease)    Past Surgical History:  Procedure Laterality Date   CERVICAL LAMINECTOMY     ENDOSCOPIC MUCOSAL RESECTION N/A 11/15/2021   Procedure: ENDOSCOPIC MUCOSAL RESECTION;  Surgeon: Normie Becton., MD;  Location: [facility];  Service: Gastroenterology;  Laterality: N/A;   EUS N/A 11/15/2021   Procedure: LOWER ENDOSCOPIC ULTRASOUND (EUS);  Surgeon: Normie Becton., MD;  Location: [facility];  Service: Gastroenterology;  Laterality: N/A;   EUS N/A 02/10/2023   Procedure: LOWER ENDOSCOPIC ULTRASOUND (EUS);  Surgeon: Normie Becton., MD;  Location: [facility];  Service: Gastroenterology;  Laterality: N/A;   FLEXIBLE SIGMOIDOSCOPY N/A 11/15/2021   Procedure: FLEXIBLE SIGMOIDOSCOPY;  Surgeon: Brice Campi Albino Alu., MD;  Location: [facility];  Service: Gastroenterology;  Laterality: N/A;   FLEXIBLE SIGMOIDOSCOPY N/A 02/10/2023   Procedure: FLEXIBLE SIGMOIDOSCOPY;  Surgeon: Brice Campi Albino Alu., MD;  Location: [facility];  Service: Gastroenterology;  Laterality: N/A;   HEMOSTASIS CLIP PLACEMENT  11/15/2021    Procedure: HEMOSTASIS CLIP PLACEMENT;  Surgeon: Normie Becton., MD;  Location: [facility];  Service: Gastroenterology;;   HERNIA REPAIR     INGUINAL HERNIA REPAIR Right 11/23/2021   Procedure: OPEN RIGHT INGUINAL HERNIA REPAIR;  Surgeon: Jacolyn Matar, MD;  Location: [facility];  Service: General;  Laterality: Right;   LUMBAR FUSION Bilateral 09/02/2019   L4-L5   LUMBAR LAMINECTOMY Bilateral 09/02/2019   MASS EXCISION Left 11/23/2021   Procedure: EXCISION OF LEFT CHEEK MASS;  Surgeon: Jacolyn Matar, MD;  Location: [facility];  Service: General;  Laterality: Left;   SUBMUCOSAL LIFTING INJECTION  11/15/2021   Procedure: SUBMUCOSAL LIFTING INJECTION;  Surgeon: Normie Becton., MD;  Location: [facility];  Service: Gastroenterology;;   SUBMUCOSAL TATTOO INJECTION  11/15/2021   Procedure: SUBMUCOSAL TATTOO INJECTION;  Surgeon: Normie Becton., MD;  Location: [facility];  Service: Gastroenterology;;   Patient Active Problem List   Diagnosis Date Noted   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   STD exposure 04/16/2022   Abdominal pain 10/23/2021   Need for shingles vaccine 07/02/2021   Inclusion cyst 07/02/2021   Onychomycosis of great toe 07/02/2021   Other fatigue 05/03/2021   Benign prostatic hyperplasia with urinary frequency 05/03/2021   Abnormal urine findings 08/28/2020   Cognitive decline 08/23/2020   Depression, recurrent (HCC) 08/23/2020   Spondylolisthesis of lumbar region 09/02/2019   Foot drop, right 04/20/2019   Erectile dysfunction due to arterial insufficiency 04/16/2019  Colon cancer screening 04/16/2019   Lumbar radiculopathy 04/08/2019   Gait instability 04/08/2019    ONSET DATE: > 2 years ago  REFERRING DIAG:  G95.20 (ICD-10-CM) - Unspecified cord compression  R26.89 (ICD-10-CM) - Other abnormalities of gait and mobility  M21.371 (ICD-10-CM) - Foot drop, right foot    THERAPY DIAG:  Bilateral foot-drop  Impaired functional mobility,  balance, gait, and endurance  Rationale for Evaluation and Treatment: Rehabilitation  SUBJECTIVE:                                                                                                                                                                                             SUBJECTIVE STATEMENT: 7/10 pain today. Pt continues to endorse fall getting out of bathtub.   Pt has had recent neck surgery that is causing numbness in his hands. Pt has been in pain for long periods of time and pain is not his biggest concerns currently.  Pt reports questions as to why he was referred to PT and doesn't note major functional limitations. Pt reports that the most annoying limitations is the drop foot. Pt's foot drop is from L4-5 back surgery.  Pt accompanied by: self  PERTINENT HISTORY:  Bilaterally foot drop Myelopathy  PAIN:  Are you having pain? Yes: NPRS scale: 8/10 Pain location: low back Pain description: constant ache Aggravating factors: Weather Relieving factors: beautiful sunny day  PRECAUTIONS: None  RED FLAGS: None   WEIGHT BEARING RESTRICTIONS: No  FALLS: Has patient fallen in last 6 months? Yes. Number of falls 5-6   PATIENT GOALS: "fall less"  OBJECTIVE:  Note: Objective measures were completed at Evaluation unless otherwise noted.  DIAGNOSTIC FINDINGS:   COGNITION: Overall cognitive status: Within functional limits for tasks assessed   SENSATION: Light touch: Impaired   COORDINATION: WFL   MUSCLE TONE: LLE: Flaccid  POSTURE: No Significant postural limitations  LOWER EXTREMITY ROM:     Active  Right Eval Left Eval  Hip flexion    Hip extension    Hip abduction    Hip adduction    Hip internal rotation    Hip external rotation    Knee flexion    Knee extension    Ankle dorsiflexion    Ankle plantarflexion    Ankle inversion    Ankle eversion     (Blank rows = not tested)  LOWER EXTREMITY MMT:    MMT Right Eval Left Eval  Hip  flexion 3 3  Hip extension 3- 2+  Hip abduction 2+ 2+  Hip adduction    Hip internal rotation    Hip external rotation  Knee flexion    Knee extension 4- 4-  Ankle dorsiflexion 0 0  Ankle plantarflexion    Ankle inversion    Ankle eversion    (Blank rows = not tested)    GAIT: Gait pattern: step through pattern, circumduction- Right, circumduction- Left, shuffling, wide BOS, poor foot clearance- Right, and poor foot clearance- Left Distance walked: 147ft Assistive device utilized: Single point cane and bilateral AFOs Level of assistance: Complete Independence Comments: during DGI.   FUNCTIONAL TESTS:   DGI 1. Gait level surface (2) Mild Impairment: Walks 20', uses assistive devices, slower speed, mild gait deviations. 2. Change in gait speed (2) Mild Impairment: Is able to change speed but demonstrates mild gait deviations, or not gait deviations but unable to achieve a significant change in velocity, or uses an assistive device. 3. Gait with horizontal head turns (1) Moderate Impairment: Performs head turns with moderate change in gait velocity, slows down, staggers but recovers, can continue to walk. 4. Gait with vertical head turns (2) Mild Impairment: Performs head turns smoothly with slight change in gait velocity, i.e., minor disruption to smooth gait path or uses walking aid. 5. Gait and pivot turn (2) Mild Impairment: Pivot turns safely in > 3 seconds and stops with no loss of balance. 6. Step over obstacle (2) Mild Impairment: Is able to step over box, but must slow down and adjust steps to clear box safely. 7. Step around obstacles (2) Mild Impairment: Is able to step around both cones, but must slow down and adjust steps to clear cones. 8. Stairs (2) Mild Impairment: Alternating feet, must use rail.  TOTAL SCORE: 15/ 24  L SLS: 2 seconds  R SLS: Unable to achieve  [place]: 18-39  F: 43.5 seconds  M: 43.2 seconds 40-49  F: 40.4 seconds  M: 40.1  seconds 50-59  F: 36 seconds  M: 38.1 seconds 60-69  F: 25.1 seconds  M: 28.7 seconds 70-79  F: 11.3 seconds  M: 18.3 seconds  PATIENT SURVEYS:  ABC scale 1000/1600= 62.5%                                                                                                                              TREATMENT DATE:  07/15/2023  -3x1' tandem balance on aeromat -6in forward foot taps in // bars x 20 with minor LOB and intermittent touchdown of BUE support -6in lateral foot taps in // bars x 20- with mild LOB and more consistent touchdown on // bars -Side stepping with GTB at knees 25ft x 3 -Monster walks forward/backward with GTB at knees 50ft x 3 -Leg press #5 plate x 20; #6 plate x 15 --> #8 plate Z61  -8' [place] STEM Cycle time: 10/10  Duty Cycle: 50%  Ramp: 2 seconds    07/09/23: -NuStep, seat 14, level 7, -Foot taps in // bars, 2 inch step 1 set-->6 inch 2 sets, 3x10, one UE--> no UE Support -Leg Press  machine: plate 4 16X, plate 096E, plate 6 45W0 -Standing marches on foam pad in // bars, 10x one UE support, 2x10 no UE support -SLS in // bars: 30"x2 -Standing hip ext: 2 sets 10 reps, bilaterally, v cued for upright trunk and ext knee -Standing hip abd: 2 sets 10 reps, bilaterally -Foot taps in // bars, 2 inch step 1 set-->6 inch 2 sets, 3x10, one UE--> no UE Support -Side step, 32ft, down and back 3x, 2 sets, 3 lb. Ankle weights   07/01/2023  Therapeutic Exercise: -Supine bridges, RTB, 2 sets of 10 reps, 3 second holds, symptomatic, pt cued for max hip extension -Clamshells 2 sets of 10 reps, RTB -Standing 3 way hip 2 sets 5 reps, bilaterally, pt cued for upright trunk and maintaining of neutral spine -8inch step ups 2 sets of 10 reps, alternating lead foot, pt cued for decreased UE support to tolerance -Lateral stepping 2 laps 20 feet per lap, pt cued for upright posture, pt able to complete without cane  Therapeutic Activity: -Sit to stands, 1 sets of 10 reps, pt  cued for core activation -Sit to stands, 2 sets of 10 reps, w tidal column    06/23/2023 Evaluation    PATIENT EDUCATION: Education details: PT Evaluation, findings, prognosis, frequency, attendance policy, and HEP see below. Person educated: Patient Education method: Explanation Education comprehension: verbalized understanding  HOME EXERCISE PROGRAM: Access Code: [email or web address] URL: [email or web address] Date: 07/15/2023 Prepared by: Irene Mannheim  Exercises - Clamshell with Resistance  - 1 x daily - 7 x weekly - 3 sets - 10 reps - Supine Bridge with Resistance Band  - 1 x daily - 7 x weekly - 3 sets - 10 reps - Side Stepping with Resistance at Thighs  - 1 x daily - 7 x weekly - 3 sets - 10 reps - Standing Tandem Balance with Counter Support  - 1 x daily - 7 x weekly - 3 sets - 3 reps - 60-90 hold  GOALS: Goals reviewed with patient? No  SHORT TERM GOALS: Target date: 07/21/2023  Pt will be independent with HEP in order to demonstrate participation in Physical Therapy POC.  Baseline: Goal status: INITIAL  2.  Pt will improve ABC score by 10 points in order to demonstrate improved pain with functional goals and outcomes. Baseline:  Goal status: INITIAL  LONG TERM GOALS: Target date: 08/18/2023  Pt will increase DGI score by > at least 4 points in order to demonstrate improved functional safety and balance skills in ADL/mobility.   Baseline: see objective.  Goal status: INITIAL  2.  Pt will improve BLE MMT by at least 1/2 muscle grate in order to demonstrate improved functional mobility capacity in community setting.  Baseline: see objective.  Goal status: INITIAL  3.  Pt will improve ABC score by 20 points in order to demonstrate improved pain with functional goals and outcomes. Baseline: see objective.  Goal status: INITIAL  4.  Pt will report <2 falls in PT POC in order to demonstrate improved capacity, safety, and overall quality of movement to optimize  function.  Baseline: see objective.  Goal status: INITIAL  ASSESSMENT:  CLINICAL IMPRESSION: Pt tolerating treatment well today. Addressed more dynamic balance, added Estim at EOS for tibialis anterior muscle activation, noticeable small ankle DF observed throughout time today. Will continue to add into treatment sessions as tolerated. Patient will benefit from skilled Physical Therapy services to address deficits/limitations in order to improve functional and QOL  OBJECTIVE IMPAIRMENTS: decreased balance, difficulty walking, decreased ROM, decreased strength, increased fascial restrictions, impaired sensation, postural dysfunction, and pain.   ACTIVITY LIMITATIONS: carrying, lifting, bending, standing, stairs, transfers, and locomotion level  PARTICIPATION LIMITATIONS: community activity and occupation  PERSONAL FACTORS: Age are also affecting patient's functional outcome.   REHAB POTENTIAL: Fair significant pain and spinal surgeries  CLINICAL DECISION MAKING: Stable/uncomplicated  EVALUATION COMPLEXITY: Low  PLAN:  PT FREQUENCY: 1x/week  PT DURATION: 8 weeks  PLANNED INTERVENTIONS: 97164- PT Re-evaluation, 97110-Therapeutic exercises, 97530- Therapeutic activity, [id number]- Neuromuscular re-education, 97535- Self Care, 16109- Manual therapy, [phone]- Gait training, [phone]- Orthotic Fit/training, [phone]- Electrical stimulation (unattended), [phone]- Electrical stimulation (manual), [phone]- Traction (mechanical), Patient/Family education, Balance training, Stair training, Taping, Dry Needling, Joint mobilization, Joint manipulation, Spinal manipulation, Spinal mobilization, Moist heat, and Biofeedback  PLAN FOR NEXT SESSION: BLE strengthening, try e-stim on B anterior tib, functional strengthening and balance.   Gatha Kaska PT, DPT [facility] [phone] office  9:34 AM, 07/15/23

## 2023-07-22 ENCOUNTER — Ambulatory Visit (HOSPITAL_COMMUNITY)

## 2023-07-22 DIAGNOSIS — M21371 Foot drop, right foot: Secondary | ICD-10-CM

## 2023-07-22 DIAGNOSIS — Z7409 Other reduced mobility: Secondary | ICD-10-CM

## 2023-07-22 NOTE — Therapy (Signed)
 OUTPATIENT PHYSICAL THERAPY NEURO EVALUATION   Patient Name: Thomas Rocha MRN: [id number] DOB:[birth year], 53 y.o., male Today's Date: 07/22/2023   PCP: Tonna Frederic, MD REFERRING PROVIDER: Marikay Show, NP  END OF SESSION:  PT End of Session - 07/22/23 0849     Visit Number 5    Number of Visits 9    Date for PT Re-Evaluation 08/18/23    Authorization Type [place] [employer]    Authorization Time Period 8 visits from 3/31 to 5/30    Authorization - Visit Number 4    Authorization - Number of Visits 8    Progress Note Due on Visit 8    PT Start Time 0850    PT Stop Time 0930    PT Time Calculation (min) 40 min    Activity Tolerance Patient tolerated treatment well    Behavior During Therapy WFL for tasks assessed/performed               Past Medical History:  Diagnosis Date   Anxiety    pt denies   Arthritis    Depression    Foot drop    Bilateral feet   GERD (gastroesophageal reflux disease)    Past Surgical History:  Procedure Laterality Date   CERVICAL LAMINECTOMY     ENDOSCOPIC MUCOSAL RESECTION N/A 11/15/2021   Procedure: ENDOSCOPIC MUCOSAL RESECTION;  Surgeon: Normie Becton., MD;  Location: [facility];  Service: Gastroenterology;  Laterality: N/A;   EUS N/A 11/15/2021   Procedure: LOWER ENDOSCOPIC ULTRASOUND (EUS);  Surgeon: Normie Becton., MD;  Location: [facility];  Service: Gastroenterology;  Laterality: N/A;   EUS N/A 02/10/2023   Procedure: LOWER ENDOSCOPIC ULTRASOUND (EUS);  Surgeon: Normie Becton., MD;  Location: [facility];  Service: Gastroenterology;  Laterality: N/A;   FLEXIBLE SIGMOIDOSCOPY N/A 11/15/2021   Procedure: FLEXIBLE SIGMOIDOSCOPY;  Surgeon: Brice Campi Albino Alu., MD;  Location: [facility];  Service: Gastroenterology;  Laterality: N/A;   FLEXIBLE SIGMOIDOSCOPY N/A 02/10/2023   Procedure: FLEXIBLE SIGMOIDOSCOPY;  Surgeon: Brice Campi Albino Alu., MD;  Location: [facility];  Service:  Gastroenterology;  Laterality: N/A;   HEMOSTASIS CLIP PLACEMENT  11/15/2021   Procedure: HEMOSTASIS CLIP PLACEMENT;  Surgeon: Normie Becton., MD;  Location: [facility];  Service: Gastroenterology;;   HERNIA REPAIR     INGUINAL HERNIA REPAIR Right 11/23/2021   Procedure: OPEN RIGHT INGUINAL HERNIA REPAIR;  Surgeon: Jacolyn Matar, MD;  Location: [facility];  Service: General;  Laterality: Right;   LUMBAR FUSION Bilateral 09/02/2019   L4-L5   LUMBAR LAMINECTOMY Bilateral 09/02/2019   MASS EXCISION Left 11/23/2021   Procedure: EXCISION OF LEFT CHEEK MASS;  Surgeon: Jacolyn Matar, MD;  Location: [facility];  Service: General;  Laterality: Left;   SUBMUCOSAL LIFTING INJECTION  11/15/2021   Procedure: SUBMUCOSAL LIFTING INJECTION;  Surgeon: Normie Becton., MD;  Location: [facility];  Service: Gastroenterology;;   SUBMUCOSAL TATTOO INJECTION  11/15/2021   Procedure: SUBMUCOSAL TATTOO INJECTION;  Surgeon: Normie Becton., MD;  Location: [facility];  Service: Gastroenterology;;   Patient Active Problem List   Diagnosis Date Noted   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   STD exposure 04/16/2022   Abdominal pain 10/23/2021   Need for shingles vaccine 07/02/2021   Inclusion cyst 07/02/2021   Onychomycosis of great toe 07/02/2021   Other fatigue 05/03/2021   Benign prostatic hyperplasia with urinary frequency 05/03/2021   Abnormal urine findings 08/28/2020   Cognitive decline 08/23/2020   Depression, recurrent (HCC) 08/23/2020  Spondylolisthesis of lumbar region 09/02/2019   Foot drop, right 04/20/2019   Erectile dysfunction due to arterial insufficiency 04/16/2019   Colon cancer screening 04/16/2019   Lumbar radiculopathy 04/08/2019   Gait instability 04/08/2019    ONSET DATE: > 2 years ago  REFERRING DIAG:  G95.20 (ICD-10-CM) - Unspecified cord compression  R26.89 (ICD-10-CM) - Other abnormalities of gait and mobility  M21.371 (ICD-10-CM) - Foot drop, right foot     THERAPY DIAG:  Bilateral foot-drop  Impaired functional mobility, balance, gait, and endurance  Rationale for Evaluation and Treatment: Rehabilitation  SUBJECTIVE:                                                                                                                                                                                             SUBJECTIVE STATEMENT: Low back pain this morning 7/10; liked using the e-stim for his legs; not wearing back brace today; arrives with bilateral AFO's  Pt has had recent neck surgery that is causing numbness in his hands. Pt has been in pain for long periods of time and pain is not his biggest concerns currently.  Pt reports questions as to why he was referred to PT and doesn't note major functional limitations. Pt reports that the most annoying limitations is the drop foot. Pt's foot drop is from L4-5 back surgery.  Pt accompanied by: self  PERTINENT HISTORY:  Bilaterally foot drop Myelopathy  PAIN:  Are you having pain? Yes: NPRS scale: 8/10 Pain location: low back Pain description: constant ache Aggravating factors: Weather Relieving factors: beautiful sunny day  PRECAUTIONS: None  RED FLAGS: None   WEIGHT BEARING RESTRICTIONS: No  FALLS: Has patient fallen in last 6 months? Yes. Number of falls 5-6   PATIENT GOALS: "fall less"  OBJECTIVE:  Note: Objective measures were completed at Evaluation unless otherwise noted.  DIAGNOSTIC FINDINGS:   COGNITION: Overall cognitive status: Within functional limits for tasks assessed   SENSATION: Light touch: Impaired   COORDINATION: WFL   MUSCLE TONE: LLE: Flaccid  POSTURE: No Significant postural limitations  LOWER EXTREMITY ROM:     Active  Right Eval Left Eval  Hip flexion    Hip extension    Hip abduction    Hip adduction    Hip internal rotation    Hip external rotation    Knee flexion    Knee extension    Ankle dorsiflexion    Ankle plantarflexion     Ankle inversion    Ankle eversion     (Blank rows = not tested)  LOWER EXTREMITY MMT:    MMT Right Eval Left Eval  Hip flexion 3 3  Hip extension 3- 2+  Hip abduction 2+ 2+  Hip adduction    Hip internal rotation    Hip external rotation    Knee flexion    Knee extension 4- 4-  Ankle dorsiflexion 0 0  Ankle plantarflexion    Ankle inversion    Ankle eversion    (Blank rows = not tested)    GAIT: Gait pattern: step through pattern, circumduction- Right, circumduction- Left, shuffling, wide BOS, poor foot clearance- Right, and poor foot clearance- Left Distance walked: 116ft Assistive device utilized: Single point cane and bilateral AFOs Level of assistance: Complete Independence Comments: during DGI.   FUNCTIONAL TESTS:   DGI 1. Gait level surface (2) Mild Impairment: Walks 20', uses assistive devices, slower speed, mild gait deviations. 2. Change in gait speed (2) Mild Impairment: Is able to change speed but demonstrates mild gait deviations, or not gait deviations but unable to achieve a significant change in velocity, or uses an assistive device. 3. Gait with horizontal head turns (1) Moderate Impairment: Performs head turns with moderate change in gait velocity, slows down, staggers but recovers, can continue to walk. 4. Gait with vertical head turns (2) Mild Impairment: Performs head turns smoothly with slight change in gait velocity, i.e., minor disruption to smooth gait path or uses walking aid. 5. Gait and pivot turn (2) Mild Impairment: Pivot turns safely in > 3 seconds and stops with no loss of balance. 6. Step over obstacle (2) Mild Impairment: Is able to step over box, but must slow down and adjust steps to clear box safely. 7. Step around obstacles (2) Mild Impairment: Is able to step around both cones, but must slow down and adjust steps to clear cones. 8. Stairs (2) Mild Impairment: Alternating feet, must use rail.  TOTAL SCORE: 15/ 24  L SLS: 2  seconds  R SLS: Unable to achieve  [place]: 18-39  F: 43.5 seconds  M: 43.2 seconds 40-49  F: 40.4 seconds  M: 40.1 seconds 50-59  F: 36 seconds  M: 38.1 seconds 60-69  F: 25.1 seconds  M: 28.7 seconds 70-79  F: 11.3 seconds  M: 18.3 seconds  PATIENT SURVEYS:  ABC scale 1000/1600= 62.5%                                                                                                                              TREATMENT DATE:  07/22/23 6 " step toe taps 2 x 10 each 6" lateral toe taps 2 x 10 each // bars navigate hurdles  2 short and 1 tall down and back x 3 Tandem stance on foam beam 2 x 20" each Sit to stand on foam pad 2 x 5 8' Russian stim to bilat anterior tib cycle time 10/10, ramp 2 sec     07/15/2023  -3x1' tandem balance on aeromat -6in forward foot taps in // bars x 20 with minor LOB and intermittent touchdown of  BUE support -6in lateral foot taps in // bars x 20- with mild LOB and more consistent touchdown on // bars -Side stepping with GTB at knees 67ft x 3 -Monster walks forward/backward with GTB at knees 40ft x 3 -Leg press #5 plate x 20; #6 plate x 15 --> #8 plate W09  -8' [place] STEM Cycle time: 10/10  Duty Cycle: 50%  Ramp: 2 seconds    07/09/23: -NuStep, seat 14, level 7, -Foot taps in // bars, 2 inch step 1 set-->6 inch 2 sets, 3x10, one UE--> no UE Support -Leg Press machine: plate 4 81X, plate 914N, plate 6 82N5 -Standing marches on foam pad in // bars, 10x one UE support, 2x10 no UE support -SLS in // bars: 30"x2 -Standing hip ext: 2 sets 10 reps, bilaterally, v cued for upright trunk and ext knee -Standing hip abd: 2 sets 10 reps, bilaterally -Foot taps in // bars, 2 inch step 1 set-->6 inch 2 sets, 3x10, one UE--> no UE Support -Side step, 49ft, down and back 3x, 2 sets, 3 lb. Ankle weights   07/01/2023  Therapeutic Exercise: -Supine bridges, RTB, 2 sets of 10 reps, 3 second holds, symptomatic, pt cued for max hip  extension -Clamshells 2 sets of 10 reps, RTB -Standing 3 way hip 2 sets 5 reps, bilaterally, pt cued for upright trunk and maintaining of neutral spine -8inch step ups 2 sets of 10 reps, alternating lead foot, pt cued for decreased UE support to tolerance -Lateral stepping 2 laps 20 feet per lap, pt cued for upright posture, pt able to complete without cane  Therapeutic Activity: -Sit to stands, 1 sets of 10 reps, pt cued for core activation -Sit to stands, 2 sets of 10 reps, w tidal column    06/23/2023 Evaluation    PATIENT EDUCATION: Education details: PT Evaluation, findings, prognosis, frequency, attendance policy, and HEP see below. Person educated: Patient Education method: Explanation Education comprehension: verbalized understanding  HOME EXERCISE PROGRAM: Access Code: [email or web address] URL: [email or web address] Date: 07/15/2023 Prepared by: Irene Mannheim  Exercises - Clamshell with Resistance  - 1 x daily - 7 x weekly - 3 sets - 10 reps - Supine Bridge with Resistance Band  - 1 x daily - 7 x weekly - 3 sets - 10 reps - Side Stepping with Resistance at Thighs  - 1 x daily - 7 x weekly - 3 sets - 10 reps - Standing Tandem Balance with Counter Support  - 1 x daily - 7 x weekly - 3 sets - 3 reps - 60-90 hold  GOALS: Goals reviewed with patient? No  SHORT TERM GOALS: Target date: 07/21/2023  Pt will be independent with HEP in order to demonstrate participation in Physical Therapy POC.  Baseline: Goal status: INITIAL  2.  Pt will improve ABC score by 10 points in order to demonstrate improved pain with functional goals and outcomes. Baseline:  Goal status: INITIAL  LONG TERM GOALS: Target date: 08/18/2023  Pt will increase DGI score by > at least 4 points in order to demonstrate improved functional safety and balance skills in ADL/mobility.   Baseline: see objective.  Goal status: INITIAL  2.  Pt will improve BLE MMT by at least 1/2 muscle grate in order to  demonstrate improved functional mobility capacity in community setting.  Baseline: see objective.  Goal status: INITIAL  3.  Pt will improve ABC score by 20 points in order to demonstrate improved pain with functional goals and outcomes. Baseline: see objective.  Goal status: INITIAL  4.  Pt will report <2 falls in PT POC in order to demonstrate improved capacity, safety, and overall quality of movement to optimize function.  Baseline: see objective.  Goal status: INITIAL  ASSESSMENT:  CLINICAL IMPRESSION: Continued to work on addressing functional balance; patient needed less UE assist with lateral toe taps today; only used hands 1 time due to LOB. Patient with minimal difficulty with short hurdle navigation but challenged with tall hurdle.  Most challenge with tandem stance on foam.  Patient demonstrates improving awareness of posture during treatment.  Continued with e-stim to anterior tibs with minimal contraction noted.  Patient will benefit from skilled Physical Therapy services to address deficits/limitations in order to improve functional and QOL      OBJECTIVE IMPAIRMENTS: decreased balance, difficulty walking, decreased ROM, decreased strength, increased fascial restrictions, impaired sensation, postural dysfunction, and pain.   ACTIVITY LIMITATIONS: carrying, lifting, bending, standing, stairs, transfers, and locomotion level  PARTICIPATION LIMITATIONS: community activity and occupation  PERSONAL FACTORS: Age are also affecting patient's functional outcome.   REHAB POTENTIAL: Fair significant pain and spinal surgeries  CLINICAL DECISION MAKING: Stable/uncomplicated  EVALUATION COMPLEXITY: Low  PLAN:  PT FREQUENCY: 1x/week  PT DURATION: 8 weeks  PLANNED INTERVENTIONS: 97164- PT Re-evaluation, 97110-Therapeutic exercises, 97530- Therapeutic activity, [id number]- Neuromuscular re-education, 97535- Self Care, 16109- Manual therapy, [phone]- Gait training, [phone]- Orthotic  Fit/training, [phone]- Electrical stimulation (unattended), [phone]- Electrical stimulation (manual), [phone]- Traction (mechanical), Patient/Family education, Balance training, Stair training, Taping, Dry Needling, Joint mobilization, Joint manipulation, Spinal manipulation, Spinal mobilization, Moist heat, and Biofeedback  PLAN FOR NEXT SESSION: BLE strengthening, try e-stim on B anterior tib, functional strengthening and balance.   9:28 AM, 07/22/23 Heavenly Christine Small Antawn Sison MPT [place] [facility] [place] [phone]

## 2023-07-29 ENCOUNTER — Encounter (HOSPITAL_COMMUNITY)

## 2023-08-05 ENCOUNTER — Encounter (HOSPITAL_COMMUNITY)

## 2023-08-12 ENCOUNTER — Ambulatory Visit (HOSPITAL_COMMUNITY): Attending: Nurse Practitioner

## 2023-08-12 ENCOUNTER — Encounter (HOSPITAL_COMMUNITY): Payer: Self-pay

## 2023-08-12 DIAGNOSIS — M21371 Foot drop, right foot: Secondary | ICD-10-CM | POA: Diagnosis present

## 2023-08-12 DIAGNOSIS — M21372 Foot drop, left foot: Secondary | ICD-10-CM | POA: Diagnosis present

## 2023-08-12 DIAGNOSIS — Z7409 Other reduced mobility: Secondary | ICD-10-CM | POA: Diagnosis present

## 2023-08-12 NOTE — Therapy (Signed)
 OUTPATIENT PHYSICAL THERAPY NEURO EVALUATION   Patient Name: Thomas Rocha MRN: [id number] DOB:[birth year], 53 y.o., male Today's Date: 08/12/2023   PCP: Tonna Frederic, MD REFERRING PROVIDER: Marikay Show, NP  END OF SESSION:  PT End of Session - 08/12/23 0859     Visit Number 6    Number of Visits 9    Date for PT Re-Evaluation 08/18/23    Authorization Type [employer]    Authorization Time Period 8 visits from 3/31 to 5/30    Authorization - Visit Number 5    Authorization - Number of Visits 8    Progress Note Due on Visit 8    PT Start Time 0847    PT Stop Time 0925    PT Time Calculation (min) 38 min    Activity Tolerance Patient tolerated treatment well;Patient limited by pain    Behavior During Therapy [facility] for tasks assessed/performed                Past Medical History:  Diagnosis Date   Anxiety    pt denies   Arthritis    Depression    Foot drop    Bilateral feet   GERD (gastroesophageal reflux disease)    Past Surgical History:  Procedure Laterality Date   CERVICAL LAMINECTOMY     ENDOSCOPIC MUCOSAL RESECTION N/A 11/15/2021   Procedure: ENDOSCOPIC MUCOSAL RESECTION;  Surgeon: Normie Becton., MD;  Location: [facility];  Service: Gastroenterology;  Laterality: N/A;   EUS N/A 11/15/2021   Procedure: LOWER ENDOSCOPIC ULTRASOUND (EUS);  Surgeon: Normie Becton., MD;  Location: [facility];  Service: Gastroenterology;  Laterality: N/A;   EUS N/A 02/10/2023   Procedure: LOWER ENDOSCOPIC ULTRASOUND (EUS);  Surgeon: Normie Becton., MD;  Location: [facility];  Service: Gastroenterology;  Laterality: N/A;   FLEXIBLE SIGMOIDOSCOPY N/A 11/15/2021   Procedure: FLEXIBLE SIGMOIDOSCOPY;  Surgeon: Brice Campi Albino Alu., MD;  Location: [facility];  Service: Gastroenterology;  Laterality: N/A;   FLEXIBLE SIGMOIDOSCOPY N/A 02/10/2023   Procedure: FLEXIBLE SIGMOIDOSCOPY;  Surgeon: Brice Campi Albino Alu., MD;   Location: [facility];  Service: Gastroenterology;  Laterality: N/A;   HEMOSTASIS CLIP PLACEMENT  11/15/2021   Procedure: HEMOSTASIS CLIP PLACEMENT;  Surgeon: Normie Becton., MD;  Location: [facility];  Service: Gastroenterology;;   HERNIA REPAIR     INGUINAL HERNIA REPAIR Right 11/23/2021   Procedure: OPEN RIGHT INGUINAL HERNIA REPAIR;  Surgeon: Jacolyn Matar, MD;  Location: [facility];  Service: General;  Laterality: Right;   LUMBAR FUSION Bilateral 09/02/2019   L4-L5   LUMBAR LAMINECTOMY Bilateral 09/02/2019   MASS EXCISION Left 11/23/2021   Procedure: EXCISION OF LEFT CHEEK MASS;  Surgeon: Jacolyn Matar, MD;  Location: [facility];  Service: General;  Laterality: Left;   SUBMUCOSAL LIFTING INJECTION  11/15/2021   Procedure: SUBMUCOSAL LIFTING INJECTION;  Surgeon: Normie Becton., MD;  Location: [facility];  Service: Gastroenterology;;   SUBMUCOSAL TATTOO INJECTION  11/15/2021   Procedure: SUBMUCOSAL TATTOO INJECTION;  Surgeon: Normie Becton., MD;  Location: [facility];  Service: Gastroenterology;;   Patient Active Problem List   Diagnosis Date Noted   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   STD exposure 04/16/2022   Abdominal pain 10/23/2021   Need for shingles vaccine 07/02/2021   Inclusion cyst 07/02/2021   Onychomycosis of great toe 07/02/2021   Other fatigue 05/03/2021   Benign prostatic hyperplasia with urinary frequency 05/03/2021   Abnormal urine findings 08/28/2020   Cognitive decline 08/23/2020   Depression, recurrent (  HCC) 08/23/2020   Spondylolisthesis of lumbar region 09/02/2019   Foot drop, right 04/20/2019   Erectile dysfunction due to arterial insufficiency 04/16/2019   Colon cancer screening 04/16/2019   Lumbar radiculopathy 04/08/2019   Gait instability 04/08/2019    ONSET DATE: > 2 years ago  REFERRING DIAG:  G95.20 (ICD-10-CM) - Unspecified cord compression  R26.89 (ICD-10-CM) - Other abnormalities of gait and mobility  M21.371  (ICD-10-CM) - Foot drop, right foot    THERAPY DIAG:  Bilateral foot-drop  Impaired functional mobility, balance, gait, and endurance  Rationale for Evaluation and Treatment: Rehabilitation  SUBJECTIVE:                                                                                                                                                                                             SUBJECTIVE STATEMENT: Pt reports he is especially sore in his low back, 8/10 soreness reported. Arrives with bilateral AFOs and SPC.  Pt has had recent neck surgery that is causing numbness in his hands. Pt has been in pain for long periods of time and pain is not his biggest concerns currently.  Pt reports questions as to why he was referred to PT and doesn't note major functional limitations. Pt reports that the most annoying limitations is the drop foot. Pt's foot drop is from L4-5 back surgery.  Pt accompanied by: self  PERTINENT HISTORY:  Bilaterally foot drop Myelopathy  PAIN:  Are you having pain? Yes: NPRS scale: 8/10 Pain location: low back Pain description: constant ache Aggravating factors: Weather Relieving factors: beautiful sunny day  PRECAUTIONS: None  RED FLAGS: None   WEIGHT BEARING RESTRICTIONS: No  FALLS: Has patient fallen in last 6 months? Yes. Number of falls 5-6   PATIENT GOALS: "fall less"  OBJECTIVE:  Note: Objective measures were completed at Evaluation unless otherwise noted.  DIAGNOSTIC FINDINGS:   COGNITION: Overall cognitive status: Within functional limits for tasks assessed   SENSATION: Light touch: Impaired   COORDINATION: WFL   MUSCLE TONE: LLE: Flaccid  POSTURE: No Significant postural limitations  LOWER EXTREMITY ROM:     Active  Right Eval Left Eval  Hip flexion    Hip extension    Hip abduction    Hip adduction    Hip internal rotation    Hip external rotation    Knee flexion    Knee extension    Ankle dorsiflexion     Ankle plantarflexion    Ankle inversion    Ankle eversion     (Blank rows = not tested)  LOWER EXTREMITY MMT:    MMT Right Eval Left Eval  Hip flexion 3 3  Hip extension 3- 2+  Hip abduction 2+ 2+  Hip adduction    Hip internal rotation    Hip external rotation    Knee flexion    Knee extension 4- 4-  Ankle dorsiflexion 0 0  Ankle plantarflexion    Ankle inversion    Ankle eversion    (Blank rows = not tested)    GAIT: Gait pattern: step through pattern, circumduction- Right, circumduction- Left, shuffling, wide BOS, poor foot clearance- Right, and poor foot clearance- Left Distance walked: 178ft Assistive device utilized: Single point cane and bilateral AFOs Level of assistance: Complete Independence Comments: during DGI.   FUNCTIONAL TESTS:   DGI 1. Gait level surface (2) Mild Impairment: Walks 20', uses assistive devices, slower speed, mild gait deviations. 2. Change in gait speed (2) Mild Impairment: Is able to change speed but demonstrates mild gait deviations, or not gait deviations but unable to achieve a significant change in velocity, or uses an assistive device. 3. Gait with horizontal head turns (1) Moderate Impairment: Performs head turns with moderate change in gait velocity, slows down, staggers but recovers, can continue to walk. 4. Gait with vertical head turns (2) Mild Impairment: Performs head turns smoothly with slight change in gait velocity, i.e., minor disruption to smooth gait path or uses walking aid. 5. Gait and pivot turn (2) Mild Impairment: Pivot turns safely in > 3 seconds and stops with no loss of balance. 6. Step over obstacle (2) Mild Impairment: Is able to step over box, but must slow down and adjust steps to clear box safely. 7. Step around obstacles (2) Mild Impairment: Is able to step around both cones, but must slow down and adjust steps to clear cones. 8. Stairs (2) Mild Impairment: Alternating feet, must use rail.  TOTAL  SCORE: 15/ 24  L SLS: 2 seconds  R SLS: Unable to achieve  [place]: 18-39  F: 43.5 seconds  M: 43.2 seconds 40-49  F: 40.4 seconds  M: 40.1 seconds 50-59  F: 36 seconds  M: 38.1 seconds 60-69  F: 25.1 seconds  M: 28.7 seconds 70-79  F: 11.3 seconds  M: 18.3 seconds  PATIENT SURVEYS:  ABC scale 1000/1600= 62.5%                                                                                                                              TREATMENT DATE:  08/12/2023  Therapeutic Exercise: -LTR 2 sets of 10 reps -Single knee to chest, 2 sets of 30 second holds -Standing 3 way hip 2 sets 5 reps, bilaterally, pt cued for upright trunk and maintaining of neutral spine -Leg press, 4 sets of 10 reps, plate 6 x2 , plate 8, plate 7, pt states this feel good -Aero mat tandem walks, 3 laps, pt cued for decreased UE support -KB swings, 10#, 4 sets of 8 reps  Therapeutic Activity: -Sit to stands, with 10# KB, 2 sets of  5 reps -Sled pushes, 3 laps, 60 feet per lap, pt cued for upright posture   07/22/23 6 " step toe taps 2 x 10 each 6" lateral toe taps 2 x 10 each // bars navigate hurdles  2 short and 1 tall down and back x 3 Tandem stance on foam beam 2 x 20" each Sit to stand on foam pad 2 x 5 8' Russian stim to bilat anterior tib cycle time 10/10, ramp 2 sec     07/15/2023  -3x1' tandem balance on aeromat -6in forward foot taps in // bars x 20 with minor LOB and intermittent touchdown of BUE support -6in lateral foot taps in // bars x 20- with mild LOB and more consistent touchdown on // bars -Side stepping with GTB at knees 53ft x 3 -Monster walks forward/backward with GTB at knees 37ft x 3 -Leg press #5 plate x 20; #6 plate x 15 --> #8 plate X90  -8' [place] STEM Cycle time: 10/10  Duty Cycle: 50%  Ramp: 2 seconds       PATIENT EDUCATION: Education details: PT Evaluation, findings, prognosis, frequency, attendance policy, and HEP see below. Person educated:  Patient Education method: Explanation Education comprehension: verbalized understanding  HOME EXERCISE PROGRAM: Access Code: [email or web address] URL: [email or web address] Date: 07/15/2023 Prepared by: Irene Mannheim  Exercises - Clamshell with Resistance  - 1 x daily - 7 x weekly - 3 sets - 10 reps - Supine Bridge with Resistance Band  - 1 x daily - 7 x weekly - 3 sets - 10 reps - Side Stepping with Resistance at Thighs  - 1 x daily - 7 x weekly - 3 sets - 10 reps - Standing Tandem Balance with Counter Support  - 1 x daily - 7 x weekly - 3 sets - 3 reps - 60-90 hold  GOALS: Goals reviewed with patient? No  SHORT TERM GOALS: Target date: 07/21/2023  Pt will be independent with HEP in order to demonstrate participation in Physical Therapy POC.  Baseline: Goal status: INITIAL  2.  Pt will improve ABC score by 10 points in order to demonstrate improved pain with functional goals and outcomes. Baseline:  Goal status: INITIAL  LONG TERM GOALS: Target date: 08/18/2023  Pt will increase DGI score by > at least 4 points in order to demonstrate improved functional safety and balance skills in ADL/mobility.   Baseline: see objective.  Goal status: INITIAL  2.  Pt will improve BLE MMT by at least 1/2 muscle grate in order to demonstrate improved functional mobility capacity in community setting.  Baseline: see objective.  Goal status: INITIAL  3.  Pt will improve ABC score by 20 points in order to demonstrate improved pain with functional goals and outcomes. Baseline: see objective.  Goal status: INITIAL  4.  Pt will report <2 falls in PT POC in order to demonstrate improved capacity, safety, and overall quality of movement to optimize function.  Baseline: see objective.  Goal status: INITIAL  ASSESSMENT:  CLINICAL IMPRESSION: Patient continues to demonstrate decreased LE strength, decreased gait quality and balance. Patient also demonstrates increased low back pain/soreness  during today's session. Patient able to progress dynamic balance and core activation exercises today with unilateral kettle bell farmer carries and kettle bell swings, good performance with verbal cueing. Patient would continue to benefit from skilled physical therapy for increased endurance with ambulation, increased LE strength, and improved balance for improved quality of life, improved independence with gait training and continued progress towards therapy  goals.   Continued to work on addressing functional balance; patient needed less UE assist with lateral toe taps today; only used hands 1 time due to LOB. Patient with minimal difficulty with short hurdle navigation but challenged with tall hurdle.  Most challenge with tandem stance on foam.  Patient demonstrates improving awareness of posture during treatment.  Continued with e-stim to anterior tibs with minimal contraction noted.  Patient will benefit from skilled Physical Therapy services to address deficits/limitations in order to improve functional and QOL      OBJECTIVE IMPAIRMENTS: decreased balance, difficulty walking, decreased ROM, decreased strength, increased fascial restrictions, impaired sensation, postural dysfunction, and pain.   ACTIVITY LIMITATIONS: carrying, lifting, bending, standing, stairs, transfers, and locomotion level  PARTICIPATION LIMITATIONS: community activity and occupation  PERSONAL FACTORS: Age are also affecting patient's functional outcome.   REHAB POTENTIAL: Fair significant pain and spinal surgeries  CLINICAL DECISION MAKING: Stable/uncomplicated  EVALUATION COMPLEXITY: Low  PLAN:  PT FREQUENCY: 1x/week  PT DURATION: 8 weeks  PLANNED INTERVENTIONS: 97164- PT Re-evaluation, 97110-Therapeutic exercises, 97530- Therapeutic activity, [id number]- Neuromuscular re-education, 97535- Self Care, 16109- Manual therapy, [phone]- Gait training, [phone]- Orthotic Fit/training, [phone]- Electrical stimulation  (unattended), [phone]- Electrical stimulation (manual), [phone]- Traction (mechanical), Patient/Family education, Balance training, Stair training, Taping, Dry Needling, Joint mobilization, Joint manipulation, Spinal manipulation, Spinal mobilization, Moist heat, and Biofeedback  PLAN FOR NEXT SESSION: BLE strengthening, try e-stim on B anterior tib, functional strengthening and balance. Reassess  Armond Bertin, PT, DPT [facility] Office: [phone] 9:31 AM, 08/12/23

## 2023-08-19 ENCOUNTER — Encounter (HOSPITAL_COMMUNITY): Payer: Self-pay

## 2023-08-19 ENCOUNTER — Ambulatory Visit (HOSPITAL_COMMUNITY)

## 2023-08-19 DIAGNOSIS — Z7409 Other reduced mobility: Secondary | ICD-10-CM

## 2023-08-19 DIAGNOSIS — M21371 Foot drop, right foot: Secondary | ICD-10-CM | POA: Diagnosis not present

## 2023-08-19 NOTE — Therapy (Signed)
 OUTPATIENT PHYSICAL THERAPY NEURO TREATMENT/PROGRESS NOTE  PHYSICAL THERAPY DISCHARGE SUMMARY  Visits from Start of Care: 5  Current functional level related to goals / functional outcomes: Lacking, pt met only met 2/6 goals.   Remaining deficits: Strength, balance, low back pain   Education / Equipment: Pt educated on POC, importance of compliance with HEP, process of returning to therapy for progressed strength and balance should pts condition worsen.    Patient agrees to discharge. Patient goals were partially met. Patient is being discharged due to did not respond to therapy.   Patient Name: Thomas Rocha MRN: [id number] DOB:Aug 03, 1970, 53 y.o., male Today's Date: 08/19/2023   PCP: Tonna Frederic, MD REFERRING PROVIDER: Marikay Show, NP  END OF SESSION:  PT End of Session - 08/19/23 1005     Visit Number 7    Number of Visits 9    Date for PT Re-Evaluation 08/19/23    Authorization Type [place] [employer]    Authorization - Visit Number 6    Authorization - Number of Visits 8    Progress Note Due on Visit 8    PT Start Time 0930    PT Stop Time 1005    PT Time Calculation (min) 35 min    Activity Tolerance Patient tolerated treatment well;Patient limited by pain    Behavior During Therapy WFL for tasks assessed/performed                 Past Medical History:  Diagnosis Date   Anxiety    pt denies   Arthritis    Depression    Foot drop    Bilateral feet   GERD (gastroesophageal reflux disease)    Past Surgical History:  Procedure Laterality Date   CERVICAL LAMINECTOMY     ENDOSCOPIC MUCOSAL RESECTION N/A 11/15/2021   Procedure: ENDOSCOPIC MUCOSAL RESECTION;  Surgeon: Normie Becton., MD;  Location: [facility];  Service: Gastroenterology;  Laterality: N/A;   EUS N/A 11/15/2021   Procedure: LOWER ENDOSCOPIC ULTRASOUND (EUS);  Surgeon: Normie Becton., MD;  Location: [facility];  Service: Gastroenterology;  Laterality:  N/A;   EUS N/A 02/10/2023   Procedure: LOWER ENDOSCOPIC ULTRASOUND (EUS);  Surgeon: Normie Becton., MD;  Location: [facility];  Service: Gastroenterology;  Laterality: N/A;   FLEXIBLE SIGMOIDOSCOPY N/A 11/15/2021   Procedure: FLEXIBLE SIGMOIDOSCOPY;  Surgeon: Brice Campi Albino Alu., MD;  Location: [facility];  Service: Gastroenterology;  Laterality: N/A;   FLEXIBLE SIGMOIDOSCOPY N/A 02/10/2023   Procedure: FLEXIBLE SIGMOIDOSCOPY;  Surgeon: Brice Campi Albino Alu., MD;  Location: [facility];  Service: Gastroenterology;  Laterality: N/A;   HEMOSTASIS CLIP PLACEMENT  11/15/2021   Procedure: HEMOSTASIS CLIP PLACEMENT;  Surgeon: Normie Becton., MD;  Location: [facility];  Service: Gastroenterology;;   HERNIA REPAIR     INGUINAL HERNIA REPAIR Right 11/23/2021   Procedure: OPEN RIGHT INGUINAL HERNIA REPAIR;  Surgeon: Jacolyn Matar, MD;  Location: [facility];  Service: General;  Laterality: Right;   LUMBAR FUSION Bilateral 09/02/2019   L4-L5   LUMBAR LAMINECTOMY Bilateral 09/02/2019   MASS EXCISION Left 11/23/2021   Procedure: EXCISION OF LEFT CHEEK MASS;  Surgeon: Jacolyn Matar, MD;  Location: [facility];  Service: General;  Laterality: Left;   SUBMUCOSAL LIFTING INJECTION  11/15/2021   Procedure: SUBMUCOSAL LIFTING INJECTION;  Surgeon: Normie Becton., MD;  Location: [facility];  Service: Gastroenterology;;   SUBMUCOSAL TATTOO INJECTION  11/15/2021   Procedure: SUBMUCOSAL TATTOO INJECTION;  Surgeon: Normie Becton., MD;  Location: [facility];  Service: Gastroenterology;;   Patient Active Problem List   Diagnosis Date Noted   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   STD exposure 04/16/2022   Abdominal pain 10/23/2021   Need for shingles vaccine 07/02/2021   Inclusion cyst 07/02/2021   Onychomycosis of great toe 07/02/2021   Other fatigue 05/03/2021   Benign prostatic hyperplasia with urinary frequency 05/03/2021   Abnormal urine findings 08/28/2020    Cognitive decline 08/23/2020   Depression, recurrent (HCC) 08/23/2020   Spondylolisthesis of lumbar region 09/02/2019   Foot drop, right 04/20/2019   Erectile dysfunction due to arterial insufficiency 04/16/2019   Colon cancer screening 04/16/2019   Lumbar radiculopathy 04/08/2019   Gait instability 04/08/2019    ONSET DATE: > 2 years ago  REFERRING DIAG:  G95.20 (ICD-10-CM) - Unspecified cord compression  R26.89 (ICD-10-CM) - Other abnormalities of gait and mobility  M21.371 (ICD-10-CM) - Foot drop, right foot    THERAPY DIAG:  Bilateral foot-drop  Impaired functional mobility, balance, gait, and endurance  Rationale for Evaluation and Treatment: Rehabilitation  SUBJECTIVE:                                                                                                                                                                                             SUBJECTIVE STATEMENT: Pt states he is feeling soreness in low back 7/10 upon presentation. Pt states he does not think therapy is making a big difference, pt states he would like to discharge today due to not feeling much better after 8 weeks. Pt states he does feel he has got stronger but has not felt any improvements in his low back pain. He is sure to describe that he does not think therapy was a complete waste of time.   Pt has had recent neck surgery that is causing numbness in his hands. Pt has been in pain for long periods of time and pain is not his biggest concerns currently.  Pt reports questions as to why he was referred to PT and doesn't note major functional limitations. Pt reports that the most annoying limitations is the drop foot. Pt's foot drop is from L4-5 back surgery.  Pt accompanied by: self  PERTINENT HISTORY:  Bilaterally foot drop Myelopathy  PAIN:  Are you having pain? Yes: NPRS scale: 8/10 Pain location: low back Pain description: constant ache Aggravating factors: Weather Relieving factors:  beautiful sunny day  PRECAUTIONS: None  RED FLAGS: None   WEIGHT BEARING RESTRICTIONS: No  FALLS: Has patient fallen in last 6 months? Yes. Number of falls 5-6   PATIENT GOALS: "fall less"  OBJECTIVE:  Note: Objective measures were  completed at Evaluation unless otherwise noted.  DIAGNOSTIC FINDINGS:   COGNITION: Overall cognitive status: Within functional limits for tasks assessed   SENSATION: Light touch: Impaired   COORDINATION: WFL   MUSCLE TONE: LLE: Flaccid  POSTURE: No Significant postural limitations  LOWER EXTREMITY ROM:     Active  Right Eval Left Eval  Hip flexion    Hip extension    Hip abduction    Hip adduction    Hip internal rotation    Hip external rotation    Knee flexion    Knee extension    Ankle dorsiflexion    Ankle plantarflexion    Ankle inversion    Ankle eversion     (Blank rows = not tested)  LOWER EXTREMITY MMT:    MMT Right Eval Left Eval Right Left  Hip flexion 3 3 4  3+  Hip extension 3- 2+ 3+ 3+  Hip abduction 2+ 2+ 3 3  Hip adduction      Hip internal rotation      Hip external rotation      Knee flexion      Knee extension 4- 4- 4+ 4+  Ankle dorsiflexion 0 0 0 0  Ankle plantarflexion      Ankle inversion      Ankle eversion      (Blank rows = not tested)    GAIT: Gait pattern: step through pattern, circumduction- Right, circumduction- Left, shuffling, wide BOS, poor foot clearance- Right, and poor foot clearance- Left Distance walked: 176ft Assistive device utilized: Single point cane and bilateral AFOs Level of assistance: Complete Independence Comments: during DGI.   FUNCTIONAL TESTS:   DGI 1. Gait level surface (2) Mild Impairment: Walks 20', uses assistive devices, slower speed, mild gait deviations. 2. Change in gait speed (2) Mild Impairment: Is able to change speed but demonstrates mild gait deviations, or not gait deviations but unable to achieve a significant change in velocity, or uses an  assistive device. 3. Gait with horizontal head turns (1) Moderate Impairment: Performs head turns with moderate change in gait velocity, slows down, staggers but recovers, can continue to walk. 4. Gait with vertical head turns (2) Mild Impairment: Performs head turns smoothly with slight change in gait velocity, i.e., minor disruption to smooth gait path or uses walking aid. 5. Gait and pivot turn (2) Mild Impairment: Pivot turns safely in > 3 seconds and stops with no loss of balance. 6. Step over obstacle (2) Mild Impairment: Is able to step over box, but must slow down and adjust steps to clear box safely. 7. Step around obstacles (2) Mild Impairment: Is able to step around both cones, but must slow down and adjust steps to clear cones. 8. Stairs (2) Mild Impairment: Alternating feet, must use rail.  TOTAL SCORE: 15/ 24  L SLS: 2 seconds  R SLS: Unable to achieve  [place]: 18-39  F: 43.5 seconds  M: 43.2 seconds 40-49  F: 40.4 seconds  M: 40.1 seconds 50-59  F: 36 seconds  M: 38.1 seconds 60-69  F: 25.1 seconds  M: 28.7 seconds 70-79  F: 11.3 seconds  M: 18.3 seconds  PATIENT SURVEYS:  ABC scale 1000/1600= 62.5% 940/1600  TREATMENT DATE:  08/19/2023  Progress note: DGI, goal tracking, ABC scale Therapeutic Exercise: -SLR, 2 sets of 5 reps per side, pt cued for hook lying position for contralateral side -Sit to stands, 2 sets of 10 reps, pt cued for core activation -Stair navigation, 2 bouts, 4 stairs, pt cued for minimal UE support   Neuromuscular Re-education: -Single knee to chest, 30 second holds, 2 sets -LTR 1 set of 10 reps bilaterally, pt cued to remain in pain free ROM -Childs pose to Prone press up, 2 sets of 5 reps -Plank, 2 sets of 20 second holds -Side lying open book with lumbar stabilization with superior LE bend and knee  contacting mat table    08/12/2023  Therapeutic Exercise: -LTR 2 sets of 10 reps -Single knee to chest, 2 sets of 30 second holds -Standing 3 way hip 2 sets 5 reps, bilaterally, pt cued for upright trunk and maintaining of neutral spine -Leg press, 4 sets of 10 reps, plate 6 x2 , plate 8, plate 7, pt states this feel good -Aero mat tandem walks, 3 laps, pt cued for decreased UE support -KB swings, 10#, 4 sets of 8 reps  Therapeutic Activity: -Sit to stands, with 10# KB, 2 sets of 5 reps -Sled pushes, 3 laps, 60 feet per lap, pt cued for upright posture   07/22/23 6 " step toe taps 2 x 10 each 6" lateral toe taps 2 x 10 each // bars navigate hurdles  2 short and 1 tall down and back x 3 Tandem stance on foam beam 2 x 20" each Sit to stand on foam pad 2 x 5 8' Russian stim to bilat anterior tib cycle time 10/10, ramp 2 sec    PATIENT EDUCATION: Education details: PT Evaluation, findings, prognosis, frequency, attendance policy, and HEP see below. Person educated: Patient Education method: Explanation Education comprehension: verbalized understanding  HOME EXERCISE PROGRAM: Access Code: [email or web address] URL: [email or web address] Date: 07/15/2023 Prepared by: Irene Mannheim  Exercises - Clamshell with Resistance  - 1 x daily - 7 x weekly - 3 sets - 10 reps - Supine Bridge with Resistance Band  - 1 x daily - 7 x weekly - 3 sets - 10 reps - Side Stepping with Resistance at Thighs  - 1 x daily - 7 x weekly - 3 sets - 10 reps - Standing Tandem Balance with Counter Support  - 1 x daily - 7 x weekly - 3 sets - 3 reps - 60-90 hold  GOALS: Goals reviewed with patient? No  SHORT TERM GOALS: Target date: 07/21/2023  Pt will be independent with HEP in order to demonstrate participation in Physical Therapy POC.  Baseline: Goal status: MET  2.  Pt will improve ABC score by 10 points in order to demonstrate improved pain with functional goals and outcomes. Baseline:  Goal  status: NOT MET  LONG TERM GOALS: Target date: 08/18/2023  Pt will increase DGI score by > at least 4 points in order to demonstrate improved functional safety and balance skills in ADL/mobility.   Baseline: see objective.  Goal status: NOT MET  2.  Pt will improve BLE MMT by at least 1/2 muscle grate in order to demonstrate improved functional mobility capacity in community setting.  Baseline: see objective.  Goal status: NOT MET  3.  Pt will improve ABC score by 20 points in order to demonstrate improved pain with functional goals and outcomes. Baseline: see objective.  Goal status: NOT MET  4.  Pt will  report <2 falls in PT POC in order to demonstrate improved capacity, safety, and overall quality of movement to optimize function.  Baseline: see objective.  Goal status: MET  ASSESSMENT:  CLINICAL IMPRESSION: Patient continues to demonstrate increased low back pain, decreased LE strength, decreased gait quality and balance. Patient also continues to demonstrate decreased endurance with ambulation. Patient has shown minimal improvements in low back pain although pt reports feeling stronger and pt able to perform all assigned therapy activities, pt continues to be limited due to back pain. Patient to be discharged at this time due to pt request and lack of progress towards therapy goals after 8 weeks. Pt educated on the importance of HEP compliance and safety at home due to indicated fall risk on DGI.       OBJECTIVE IMPAIRMENTS: decreased balance, difficulty walking, decreased ROM, decreased strength, increased fascial restrictions, impaired sensation, postural dysfunction, and pain.   ACTIVITY LIMITATIONS: carrying, lifting, bending, standing, stairs, transfers, and locomotion level  PARTICIPATION LIMITATIONS: community activity and occupation  PERSONAL FACTORS: Age are also affecting patient's functional outcome.   REHAB POTENTIAL: Fair significant pain and spinal  surgeries  CLINICAL DECISION MAKING: Stable/uncomplicated  EVALUATION COMPLEXITY: Low  PLAN:  PT FREQUENCY: 1x/week  PT DURATION: 8 weeks  PLANNED INTERVENTIONS: 97164- PT Re-evaluation, 97110-Therapeutic exercises, 97530- Therapeutic activity, 97112- Neuromuscular re-education, 97535- Self Care, 40981- Manual therapy, [phone]- Gait training, [phone]- Orthotic Fit/training, [phone]- Electrical stimulation (unattended), [phone]- Electrical stimulation (manual), [id number]- Traction (mechanical), Patient/Family education, Balance training, Stair training, Taping, Dry Needling, Joint mobilization, Joint manipulation, Spinal manipulation, Spinal mobilization, Moist heat, and Biofeedback  PLAN FOR NEXT SESSION: Discharged to HEP  Armond Bertin, PT, DPT [facility] Office: [phone] 4:47 PM, 08/19/23

## 2023-08-28 ENCOUNTER — Telehealth: Payer: Self-pay

## 2023-08-28 DIAGNOSIS — N5201 Erectile dysfunction due to arterial insufficiency: Secondary | ICD-10-CM

## 2023-08-28 MED ORDER — SILDENAFIL CITRATE 100 MG PO TABS: 50.0000 mg | ORAL_TABLET | Freq: Every day | ORAL | 3 refills | Status: DC | PRN

## 2023-08-28 NOTE — Telephone Encounter (Signed)
 Copied from CRM [id number]. Topic: General - Other >> Aug 28, 2023 12:17 PM Dorisann Garre T wrote: Reason for CRM: patient is requesting to get back on the Viagra   medication 100mg  tablets he would like a call back regarding this

## 2023-08-28 NOTE — Addendum Note (Signed)
 Addended by: Delene Feinstein on: 08/28/2023 03:12 PM   Modules accepted: Orders

## 2023-09-29 ENCOUNTER — Encounter: Payer: Self-pay | Admitting: Family Medicine

## 2023-09-29 ENCOUNTER — Ambulatory Visit (INDEPENDENT_AMBULATORY_CARE_PROVIDER_SITE_OTHER): Admitting: Family Medicine

## 2023-09-29 VITALS — BP 124/70 | HR 89 | Temp 97.6°F | Ht 73.0 in | Wt 181.2 lb

## 2023-09-29 DIAGNOSIS — N401 Enlarged prostate with lower urinary tract symptoms: Secondary | ICD-10-CM | POA: Diagnosis not present

## 2023-09-29 DIAGNOSIS — Z23 Encounter for immunization: Secondary | ICD-10-CM

## 2023-09-29 DIAGNOSIS — Z0000 Encounter for general adult medical examination without abnormal findings: Secondary | ICD-10-CM

## 2023-09-29 DIAGNOSIS — Z720 Tobacco use: Secondary | ICD-10-CM | POA: Diagnosis not present

## 2023-09-29 DIAGNOSIS — E7800 Pure hypercholesterolemia, unspecified: Secondary | ICD-10-CM

## 2023-09-29 DIAGNOSIS — N5201 Erectile dysfunction due to arterial insufficiency: Secondary | ICD-10-CM | POA: Diagnosis not present

## 2023-09-29 NOTE — Progress Notes (Signed)
 Subjective:    Thomas Rocha is a 53 y.o. male who presents for a Welcome to Medicare exam.   Cardiac Risk Factors include: smoking/ tobacco exposure;sedentary lifestyle    This is a routine wellness  examination for this patient .  Is due for his annual physical next month.  Continues to take the tamsulosin  periodically.  Urine flow is good there is no pre or postvoid dribbling.  No difficulty generating a stream.  His difficulty is with urgency.  He has nocturia 2-3 times nightly.  No longer feels the need to continue Wellbutrin .  He has not taken it in months.  Continues with a low-fat low-cholesterol diet.  Continues smoking about a pack per day. Objective:    Today's Vitals   09/29/23 1531  BP: 124/70  Pulse: 89  Temp: 97.6 F (36.4 C)  TempSrc: Temporal  SpO2: 94%  Weight: 181 lb 3.2 oz (82.2 kg)  Height: 6' 1 (1.854 m)   Body mass index is 23.91 kg/m.  Medications Outpatient Encounter Medications as of 09/29/2023  Medication Sig   AMBULATORY NON FORMULARY MEDICATION Ankle Foot Orthosis for right leg foot drop.  Disp 1. M21.371   buPROPion  (WELLBUTRIN  XL) 300 MG 24 hr tablet Take 1 tablet (300 mg total) by mouth daily.   ibuprofen  (ADVIL ) 200 MG tablet Take 200-400 mg by mouth every 8 (eight) hours as needed (pain.).   Misc. Devices MISC AFO for left foot   pregabalin  (LYRICA ) 75 MG capsule Take 1 capsule (75 mg total) by mouth 3 (three) times daily.   sildenafil  (VIAGRA ) 100 MG tablet Take 0.5-1 tablets (50-100 mg total) by mouth daily as needed for erectile dysfunction.   tamsulosin  (FLOMAX ) 0.4 MG CAPS capsule Take 1 capsule (0.4 mg total) by mouth at bedtime.   terbinafine  (LAMISIL ) 250 MG tablet Take 1 tablet (250 mg total) by mouth daily.   No facility-administered encounter medications on file as of 09/29/2023.     History: Past Medical History:  Diagnosis Date   Anxiety    pt denies   Arthritis    Depression    Foot drop    Bilateral feet   GERD  (gastroesophageal reflux disease)    Past Surgical History:  Procedure Laterality Date   CERVICAL LAMINECTOMY     ENDOSCOPIC MUCOSAL RESECTION N/A 11/15/2021   Procedure: ENDOSCOPIC MUCOSAL RESECTION;  Surgeon: Wilhelmenia Aloha Raddle., MD;  Location: [facility];  Service: Gastroenterology;  Laterality: N/A;   EUS N/A 11/15/2021   Procedure: LOWER ENDOSCOPIC ULTRASOUND (EUS);  Surgeon: Wilhelmenia Aloha Raddle., MD;  Location: [facility];  Service: Gastroenterology;  Laterality: N/A;   EUS N/A 02/10/2023   Procedure: LOWER ENDOSCOPIC ULTRASOUND (EUS);  Surgeon: Wilhelmenia Aloha Raddle., MD;  Location: [facility];  Service: Gastroenterology;  Laterality: N/A;   FLEXIBLE SIGMOIDOSCOPY N/A 11/15/2021   Procedure: FLEXIBLE SIGMOIDOSCOPY;  Surgeon: Wilhelmenia Aloha Raddle., MD;  Location: [facility];  Service: Gastroenterology;  Laterality: N/A;   FLEXIBLE SIGMOIDOSCOPY N/A 02/10/2023   Procedure: FLEXIBLE SIGMOIDOSCOPY;  Surgeon: Wilhelmenia Aloha Raddle., MD;  Location: [facility];  Service: Gastroenterology;  Laterality: N/A;   HEMOSTASIS CLIP PLACEMENT  11/15/2021   Procedure: HEMOSTASIS CLIP PLACEMENT;  Surgeon: Wilhelmenia Aloha Raddle., MD;  Location: [facility];  Service: Gastroenterology;;   HERNIA REPAIR     INGUINAL HERNIA REPAIR Right 11/23/2021   Procedure: OPEN RIGHT INGUINAL HERNIA REPAIR;  Surgeon: Gladis Cough, MD;  Location: [facility];  Service: General;  Laterality: Right;   LUMBAR FUSION Bilateral 09/02/2019  L4-L5   LUMBAR LAMINECTOMY Bilateral 09/02/2019   MASS EXCISION Left 11/23/2021   Procedure: EXCISION OF LEFT CHEEK MASS;  Surgeon: Gladis Cough, MD;  Location: [facility];  Service: General;  Laterality: Left;   NECK SURGERY N/A 04/2023   Dr. Hillman MALES LIFTING INJECTION  11/15/2021   Procedure: SUBMUCOSAL LIFTING INJECTION;  Surgeon: Wilhelmenia Aloha Raddle., MD;  Location: [facility];  Service: Gastroenterology;;   SUBMUCOSAL TATTOO INJECTION   11/15/2021   Procedure: SUBMUCOSAL TATTOO INJECTION;  Surgeon: Wilhelmenia Aloha Raddle., MD;  Location: [facility];  Service: Gastroenterology;;    Family History  Problem Relation Age of Onset   Pancreatic cancer Mother    Cancer Mother    Stroke Mother    Cirrhosis Father    Diabetes Maternal Grandmother    Hypertension Maternal Grandmother    Colon cancer Neg Hx    Esophageal cancer Neg Hx    Stomach cancer Neg Hx    Rectal cancer Neg Hx    Social History   Occupational History   Not on file  Tobacco Use   Smoking status: Every Day    Current packs/day: 0.00    Average packs/day: 1 pack/day for 10.0 years (10.0 ttl pk-yrs)    Types: Cigarettes    Start date: 2009    Last attempt to quit: 2019    Years since quitting: 6.5    Passive exposure: Current   Smokeless tobacco: Never  Vaping Use   Vaping status: Former  Substance and Sexual Activity   Alcohol use: Yes    Alcohol/week: 5.0 standard drinks of alcohol    Types: 5 Cans of beer per week    Comment: 5 beers three times a week   Drug use: Not Currently    Types: Marijuana   Sexual activity: Yes    Tobacco Counseling Ready to quit: Yes Counseling given: Not Answered   Immunizations and Health Maintenance Immunization History  Administered Date(s) Administered   PFIZER Comirnaty(Gray Top)Covid-19 Tri-Sucrose Vaccine 11/02/2019, 11/30/2019   Tdap 04/08/2019   Zoster Recombinant(Shingrix) 07/02/2021   Health Maintenance Due  Topic Date Due   Hepatitis C Screening  Never done   Pneumococcal Vaccine 73-85 Years old (1 of 2 - PCV) Never done   Hepatitis B Vaccines (1 of 3 - 19+ 3-dose series) Never done   Zoster Vaccines- Shingrix (2 of 2) 08/27/2021    Activities of Daily Living    09/29/2023    1:50 PM  In your present state of health, do you have any difficulty performing the following activities:  Hearing? 0  Vision? 1  Difficulty concentrating or making decisions? 0  Walking or climbing stairs?  1  Dressing or bathing? 0  Doing errands, shopping? 0  Preparing Food and eating ? N  Using the Toilet? N  In the past six months, have you accidently leaked urine? Y  Do you have problems with loss of bowel control? N  Managing your Medications? N  Managing your Finances? N  Housekeeping or managing your Housekeeping? Y    Physical Exam   Physical Exam Constitutional:      General: He is not in acute distress.    Appearance: Normal appearance. He is not ill-appearing, toxic-appearing or diaphoretic.  HENT:     Head: Normocephalic and atraumatic.     Right Ear: External ear normal.     Left Ear: External ear normal.  Eyes:     General: No scleral icterus.  Right eye: No discharge.        Left eye: No discharge.     Extraocular Movements: Extraocular movements intact.     Conjunctiva/sclera: Conjunctivae normal.  Pulmonary:     Effort: Pulmonary effort is normal. No respiratory distress.  Skin:    General: Skin is warm and dry.  Neurological:     Mental Status: He is alert and oriented to person, place, and time.  Psychiatric:        Mood and Affect: Mood normal.        Behavior: Behavior normal.    (optional), or other factors deemed appropriate based on the beneficiary's medical and social history and current clinical standards.    Advanced Directives: Does Patient Have a Medical Advance Directive?: No Would patient like information on creating a medical advance directive?: Yes (Inpatient - patient requests chaplain consult to create a medical advance directive)   EKG:  normal EKG, normal sinus rhythm, unchanged from previous tracings     Assessment:    This is a routine wellness  examination for this patient .  Is due for his annual physical next month.  Continues to take the tamsulosin  periodically.  Urine flow is good there is no pre or postvoid dribbling.  No difficulty generating a stream.  His difficulty is with urgency.  He has nocturia 2-3 times  nightly.  No longer feels the need to continue Wellbutrin .  He has not taken it in months.  Continues with a low-fat low-cholesterol diet.  Continues smoking about a pack per day.  Vision/Hearing screen Hearing Screening   500Hz  1000Hz  2000Hz  4000Hz   Right ear Pass Pass Pass Pass  Left ear Pass Pass Pass Pass   Vision Screening   Right eye Left eye Both eyes  Without correction     With correction 20/10 20/10 20/15      Goals   None      Depression Screen    09/29/2023    2:06 PM 09/29/2023    2:01 PM 09/02/2022    2:06 PM 04/16/2022    2:48 PM  PHQ 2/9 Scores  PHQ - 2 Score 0 0 0 0     Fall Risk    09/29/2023    2:05 PM  Fall Risk   Falls in the past year? 1  Number falls in past yr: 1  Injury with Fall? 0  Risk for fall due to : History of fall(s)  Follow up Falls evaluation completed    Cognitive Function    09/29/2023    3:34 PM 09/29/2023    2:10 PM  MMSE - Mini Mental State Exam  Orientation to time  5  Orientation to Place  5  Registration  3  Attention/ Calculation  5  Recall  3  Language- name 2 objects  2  Language- repeat  1  Language- follow 3 step command  3  Language- read & follow direction  1  Write a sentence 1 1  Copy design 1         09/29/2023    2:07 PM  6CIT Screen  What Year? 0 points  What month? 0 points  What time? 0 points  Count back from 20 0 points  Months in reverse 0 points  Repeat phrase 0 points  Total Score 0 points    Patient Care Team: Berneta Elsie Sayre, MD as PCP - General (Family Medicine)     Plan:   Follow up for yearly physical next month  I have personally reviewed and noted the following in the patient's chart:   Medical and social history Use of alcohol, tobacco or illicit drugs  Current medications and supplements including opioid prescriptions. Patient is not currently taking opioid prescriptions. Functional ability and status Nutritional status Physical activity Advanced  directives List of other physicians Hospitalizations, surgeries, and ER visits in previous 12 months Vitals Screenings to include cognitive, depression, and falls Referrals and appointments  In addition, I have reviewed and discussed with patient certain preventive protocols, quality metrics, and best practice recommendations. A written personalized care plan for preventive services as well as general preventive health recommendations were provided to patient.  He will return next month for his yearly physical where we will discuss his urinary urgency, Ed, tobacco use and the need for the Prevnar vaccine.  Recheck cholesterol.     Elsie Sim Lent, MD 09/29/2023

## 2023-10-31 ENCOUNTER — Telehealth: Payer: Self-pay

## 2023-10-31 NOTE — Telephone Encounter (Signed)
 Copied from CRM [id number]. Topic: General - Other >> Oct 31, 2023  2:00 PM Rosina BIRCH wrote: Reason for CRM: Thomas Rocha from [facility] and the patient on the line called wanting to make a colon recto cancer screening appointment. I did let the patient know and Erle that the clinical team will reach out to him regarding this CB for patient [phone]

## 2023-11-03 ENCOUNTER — Ambulatory Visit (INDEPENDENT_AMBULATORY_CARE_PROVIDER_SITE_OTHER): Payer: Medicare (Managed Care) | Admitting: Family Medicine

## 2023-11-03 VITALS — BP 112/74 | HR 67 | Temp 97.2°F | Ht 73.0 in | Wt 184.0 lb

## 2023-11-03 DIAGNOSIS — R350 Frequency of micturition: Secondary | ICD-10-CM | POA: Diagnosis not present

## 2023-11-03 DIAGNOSIS — N5201 Erectile dysfunction due to arterial insufficiency: Secondary | ICD-10-CM | POA: Diagnosis not present

## 2023-11-03 DIAGNOSIS — Z0000 Encounter for general adult medical examination without abnormal findings: Secondary | ICD-10-CM

## 2023-11-03 DIAGNOSIS — E7800 Pure hypercholesterolemia, unspecified: Secondary | ICD-10-CM | POA: Diagnosis not present

## 2023-11-03 DIAGNOSIS — N401 Enlarged prostate with lower urinary tract symptoms: Secondary | ICD-10-CM

## 2023-11-03 DIAGNOSIS — G8929 Other chronic pain: Secondary | ICD-10-CM | POA: Diagnosis not present

## 2023-11-03 DIAGNOSIS — Z131 Encounter for screening for diabetes mellitus: Secondary | ICD-10-CM

## 2023-11-03 DIAGNOSIS — M549 Dorsalgia, unspecified: Secondary | ICD-10-CM | POA: Diagnosis not present

## 2023-11-03 DIAGNOSIS — Z720 Tobacco use: Secondary | ICD-10-CM

## 2023-11-03 LAB — CBC WITH DIFFERENTIAL/PLATELET
Basophils Absolute: 0 K/uL (ref 0.0–0.1)
Basophils Relative: 0.6 % (ref 0.0–3.0)
Eosinophils Absolute: 0.1 K/uL (ref 0.0–0.7)
Eosinophils Relative: 3.7 % (ref 0.0–5.0)
HCT: 41.7 % (ref 39.0–52.0)
Hemoglobin: 14 g/dL (ref 13.0–17.0)
Lymphocytes Relative: 36 % (ref 12.0–46.0)
Lymphs Abs: 1.1 K/uL (ref 0.7–4.0)
MCHC: 33.5 g/dL (ref 30.0–36.0)
MCV: 94.7 fl (ref 78.0–100.0)
Monocytes Absolute: 0.3 K/uL (ref 0.1–1.0)
Monocytes Relative: 9.1 % (ref 3.0–12.0)
Neutro Abs: 1.6 K/uL (ref 1.4–7.7)
Neutrophils Relative %: 50.6 % (ref 43.0–77.0)
Platelets: 235 K/uL (ref 150.0–400.0)
RBC: 4.4 Mil/uL (ref 4.22–5.81)
RDW: 13.7 % (ref 11.5–15.5)
WBC: 3.1 K/uL — ABNORMAL LOW (ref 4.0–10.5)

## 2023-11-03 LAB — COMPREHENSIVE METABOLIC PANEL WITH GFR
ALT: 16 U/L (ref 0–53)
AST: 21 U/L (ref 0–37)
Albumin: 4.3 g/dL (ref 3.5–5.2)
Alkaline Phosphatase: 40 U/L (ref 39–117)
BUN: 10 mg/dL (ref 6–23)
CO2: 29 meq/L (ref 19–32)
Calcium: 9.2 mg/dL (ref 8.4–10.5)
Chloride: 104 meq/L (ref 96–112)
Creatinine, Ser: 0.65 mg/dL (ref 0.40–1.50)
GFR: 108.23 mL/min (ref >60.00)
Glucose, Bld: 93 mg/dL (ref 70–99)
Potassium: 4.4 meq/L (ref 3.5–5.1)
Sodium: 139 meq/L (ref 135–145)
Total Bilirubin: 0.6 mg/dL (ref 0.2–1.2)
Total Protein: 6.4 g/dL (ref 6.0–8.3)

## 2023-11-03 LAB — URINALYSIS, ROUTINE W REFLEX MICROSCOPIC
Bilirubin Urine: NEGATIVE
Hgb urine dipstick: NEGATIVE
Ketones, ur: NEGATIVE
Leukocytes,Ua: NEGATIVE
Nitrite: NEGATIVE
Specific Gravity, Urine: 1.01 (ref 1.000–1.030)
Total Protein, Urine: NEGATIVE
Urine Glucose: NEGATIVE
Urobilinogen, UA: 0.2 (ref 0.0–1.0)
pH: 7 (ref 5.0–8.0)

## 2023-11-03 LAB — LIPID PANEL
Cholesterol: 184 mg/dL (ref 0–200)
HDL: 42.5 mg/dL (ref >39.00)
LDL Cholesterol: 118 mg/dL — ABNORMAL HIGH (ref 0–99)
NonHDL: 141.61
Total CHOL/HDL Ratio: 4
Triglycerides: 120 mg/dL (ref 0.0–149.0)
VLDL: 24 mg/dL (ref 0.0–40.0)

## 2023-11-03 LAB — HEMOGLOBIN A1C: Hgb A1c MFr Bld: 5.8 % (ref 4.6–6.5)

## 2023-11-03 LAB — PSA: PSA: 0.37 ng/mL (ref 0.10–4.00)

## 2023-11-03 MED ORDER — PREGABALIN 75 MG PO CAPS: 75.0000 mg | ORAL_CAPSULE | Freq: Three times a day (TID) | ORAL | 0 refills | Status: DC

## 2023-11-03 MED ORDER — TAMSULOSIN HCL 0.4 MG PO CAPS: 0.4000 mg | ORAL_CAPSULE | Freq: Every day | ORAL | 2 refills | Status: AC

## 2023-11-03 MED ORDER — SILDENAFIL CITRATE 100 MG PO TABS: 50.0000 mg | ORAL_TABLET | Freq: Every day | ORAL | 3 refills | Status: AC | PRN

## 2023-11-03 NOTE — Progress Notes (Signed)
 Established Patient Office Visit   Subjective:  Patient ID: Thomas Rocha, male    DOB: 25-May-1970  Age: 53 y.o. MRN: [id number]  Chief Complaint  Patient presents with   Annual Exam    CPE. Pt is fasting    HPI Encounter Diagnoses  Name Primary?   Healthcare maintenance Yes   Chronic back pain, unspecified back location, unspecified back pain laterality    Erectile dysfunction due to arterial insufficiency    Benign prostatic hyperplasia with urinary frequency    Elevated LDL cholesterol level    Screening for diabetes mellitus    Tobacco use    For physical and follow-up of above.  Continues Lyrica  for chronic lower back pain with foot drop.  Continues follow-up with neurosurgery.  Continues tamsulosin  with good effect for BPH.  Consumes 3-4 drinks on Saturday night but not otherwise.  Continues to smoke about a pack a day.  He is interested in tobacco cessation.  He is now living in the [place] area.   Review of Systems  Constitutional: Negative.   HENT: Negative.    Eyes:  Negative for blurred vision, discharge and redness.  Respiratory: Negative.    Cardiovascular: Negative.   Gastrointestinal:  Negative for abdominal pain.  Genitourinary: Negative.   Musculoskeletal:  Positive for back pain. Negative for myalgias.  Skin:  Negative for rash.  Neurological:  Negative for tingling, loss of consciousness and weakness.  Endo/Heme/Allergies:  Negative for polydipsia.      11/03/2023   10:29 AM 09/29/2023    3:24 PM 09/29/2023    2:06 PM  Depression screen PHQ 2/9  Decreased Interest 0  0  Down, Depressed, Hopeless 0  0  PHQ - 2 Score 0  0  Altered sleeping 0 0   Tired, decreased energy 0 0   Change in appetite 0 0   Feeling bad or failure about yourself  0 0   Trouble concentrating 0 0   Moving slowly or fidgety/restless 0 0   Suicidal thoughts 0 0   PHQ-9 Score 0    Difficult doing work/chores Not difficult at all Somewhat difficult       Current Outpatient  Medications:    AMBULATORY NON FORMULARY MEDICATION, Ankle Foot Orthosis for right leg foot drop.  Disp 1. M21.371, Disp: 1 each, Rfl: 0   ibuprofen  (ADVIL ) 200 MG tablet, Take 200-400 mg by mouth every 8 (eight) hours as needed (pain.)., Disp: , Rfl:    Misc. Devices MISC, AFO for left foot, Disp: , Rfl:    terbinafine  (LAMISIL ) 250 MG tablet, Take 1 tablet (250 mg total) by mouth daily., Disp: 90 tablet, Rfl: 0   pregabalin  (LYRICA ) 75 MG capsule, Take 1 capsule (75 mg total) by mouth 3 (three) times daily., Disp: 90 capsule, Rfl: 0   sildenafil  (VIAGRA ) 100 MG tablet, Take 0.5-1 tablets (50-100 mg total) by mouth daily as needed for erectile dysfunction., Disp: 5 tablet, Rfl: 3   tamsulosin  (FLOMAX ) 0.4 MG CAPS capsule, Take 1 capsule (0.4 mg total) by mouth at bedtime., Disp: 90 capsule, Rfl: 2   Objective:     BP 112/74 (BP Location: Right Arm, Patient Position: Sitting, Cuff Size: Normal)   Pulse 67   Temp (!) 97.2 F (36.2 C) (Temporal)   Ht 6' 1 (1.854 m)   Wt 184 lb (83.5 kg)   SpO2 96%   BMI 24.28 kg/m  BP Readings from Last 3 Encounters:  11/03/23 112/74  09/29/23 124/70  02/10/23  135/86   Wt Readings from Last 3 Encounters:  11/03/23 184 lb (83.5 kg)  09/29/23 181 lb 3.2 oz (82.2 kg)  02/10/23 180 lb (81.6 kg)      Physical Exam Constitutional:      General: He is not in acute distress.    Appearance: Normal appearance. He is not ill-appearing, toxic-appearing or diaphoretic.  HENT:     Head: Normocephalic and atraumatic.     Right Ear: Tympanic membrane, ear canal and external ear normal.     Left Ear: Tympanic membrane, ear canal and external ear normal.     Mouth/Throat:     Mouth: Mucous membranes are moist.     Pharynx: Oropharynx is clear. No oropharyngeal exudate or posterior oropharyngeal erythema.  Eyes:     General: No scleral icterus.       Right eye: No discharge.        Left eye: No discharge.     Extraocular Movements: Extraocular movements  intact.     Conjunctiva/sclera: Conjunctivae normal.     Pupils: Pupils are equal, round, and reactive to light.  Cardiovascular:     Rate and Rhythm: Normal rate and regular rhythm.  Pulmonary:     Effort: Pulmonary effort is normal. No respiratory distress.     Breath sounds: Normal breath sounds. No wheezing or rales.  Abdominal:     General: Bowel sounds are normal.     Tenderness: There is no abdominal tenderness. There is no guarding or rebound.  Musculoskeletal:     Cervical back: No rigidity or tenderness.  Skin:    General: Skin is warm and dry.  Neurological:     Mental Status: He is alert and oriented to person, place, and time.  Psychiatric:        Mood and Affect: Mood normal.        Behavior: Behavior normal.      No results found for any visits on 11/03/23.    The 10-year ASCVD risk score (Arnett DK, et al., 2019) is: 7.5%    Assessment & Plan:   Healthcare maintenance -     CBC with Differential/Platelet -     Urinalysis, Routine w reflex microscopic  Chronic back pain, unspecified back location, unspecified back pain laterality -     Pregabalin ; Take 1 capsule (75 mg total) by mouth 3 (three) times daily.  Dispense: 90 capsule; Refill: 0  Erectile dysfunction due to arterial insufficiency -     Sildenafil  Citrate; Take 0.5-1 tablets (50-100 mg total) by mouth daily as needed for erectile dysfunction.  Dispense: 5 tablet; Refill: 3  Benign prostatic hyperplasia with urinary frequency -     Tamsulosin  HCl; Take 1 capsule (0.4 mg total) by mouth at bedtime.  Dispense: 90 capsule; Refill: 2 -     PSA  Elevated LDL cholesterol level -     Comprehensive metabolic panel with GFR -     Lipid panel  Screening for diabetes mellitus -     Comprehensive metabolic panel with GFR -     Hemoglobin A1c  Tobacco use -     Ambulatory referral to Smoking Cessation    Return in about 1 year (around 11/02/2024), or if symptoms worsen or fail to improve, for  chronic disease follow-up, annual physical.  Agrees to go to the smoking cessation clinic.  Information was given on managing tobacco cessation.  Information was given on health maintenance and disease prevention.  Recommended daily exercise for 30 minutes  to the extent that he is able.  Suggested that we could use a physical therapist to help him develop a routine.  He will look for one in the [place] area and let me know and I will refer him there.  Ongoing follow-up with neurosurgery.  Elsie Sim Lent, MD

## 2023-11-04 ENCOUNTER — Ambulatory Visit: Payer: Self-pay | Admitting: Family Medicine

## 2024-01-20 ENCOUNTER — Other Ambulatory Visit: Payer: Self-pay | Admitting: Family Medicine

## 2024-01-20 DIAGNOSIS — G8929 Other chronic pain: Secondary | ICD-10-CM

## 2024-01-22 LAB — COLOGUARD
COLOGUARD: NEGATIVE
Cologuard: NEGATIVE
